# Patient Record
Sex: Male | Born: 1950 | Race: White | Hispanic: No | Marital: Married | Smoking: Never smoker
Health system: Southern US, Community
[De-identification: ages and names within clinical notes are randomized; demographics above are authoritative.]

## PROBLEM LIST (undated history)

## (undated) DIAGNOSIS — N529 Male erectile dysfunction, unspecified: Secondary | ICD-10-CM

## (undated) DIAGNOSIS — G629 Polyneuropathy, unspecified: Secondary | ICD-10-CM

## (undated) DIAGNOSIS — G2581 Restless legs syndrome: Secondary | ICD-10-CM

## (undated) DIAGNOSIS — E119 Type 2 diabetes mellitus without complications: Secondary | ICD-10-CM

## (undated) DIAGNOSIS — M1990 Unspecified osteoarthritis, unspecified site: Secondary | ICD-10-CM

## (undated) DIAGNOSIS — N189 Chronic kidney disease, unspecified: Secondary | ICD-10-CM

## (undated) DIAGNOSIS — Z85828 Personal history of other malignant neoplasm of skin: Secondary | ICD-10-CM

## (undated) DIAGNOSIS — R001 Bradycardia, unspecified: Secondary | ICD-10-CM

## (undated) DIAGNOSIS — C649 Malignant neoplasm of unspecified kidney, except renal pelvis: Secondary | ICD-10-CM

## (undated) DIAGNOSIS — E782 Mixed hyperlipidemia: Secondary | ICD-10-CM

## (undated) DIAGNOSIS — E785 Hyperlipidemia, unspecified: Secondary | ICD-10-CM

## (undated) DIAGNOSIS — E1149 Type 2 diabetes mellitus with other diabetic neurological complication: Secondary | ICD-10-CM

## (undated) DIAGNOSIS — M545 Low back pain, unspecified: Secondary | ICD-10-CM

## (undated) DIAGNOSIS — G8929 Other chronic pain: Secondary | ICD-10-CM

## (undated) DIAGNOSIS — Z8659 Personal history of other mental and behavioral disorders: Secondary | ICD-10-CM

## (undated) DIAGNOSIS — Z950 Presence of cardiac pacemaker: Secondary | ICD-10-CM

## (undated) DIAGNOSIS — M109 Gout, unspecified: Secondary | ICD-10-CM

## (undated) DIAGNOSIS — M419 Scoliosis, unspecified: Secondary | ICD-10-CM

## (undated) DIAGNOSIS — R55 Syncope and collapse: Secondary | ICD-10-CM

## (undated) DIAGNOSIS — N400 Enlarged prostate without lower urinary tract symptoms: Secondary | ICD-10-CM

## (undated) DIAGNOSIS — Z8589 Personal history of malignant neoplasm of other organs and systems: Secondary | ICD-10-CM

## (undated) DIAGNOSIS — M549 Dorsalgia, unspecified: Secondary | ICD-10-CM

## (undated) DIAGNOSIS — E1140 Type 2 diabetes mellitus with diabetic neuropathy, unspecified: Secondary | ICD-10-CM

## (undated) DIAGNOSIS — I10 Essential (primary) hypertension: Secondary | ICD-10-CM

## (undated) HISTORY — DX: Restless legs syndrome: G25.81

## (undated) HISTORY — DX: Syncope and collapse: R55

## (undated) HISTORY — DX: Low back pain, unspecified: M54.50

## (undated) HISTORY — DX: Unspecified osteoarthritis, unspecified site: M19.90

## (undated) HISTORY — DX: Personal history of malignant neoplasm of other organs and systems: Z85.89

## (undated) HISTORY — DX: Chronic kidney disease, unspecified: N18.9

## (undated) HISTORY — DX: Bradycardia, unspecified: R00.1

## (undated) HISTORY — DX: Type 2 diabetes mellitus with other diabetic neurological complication: E11.49

## (undated) HISTORY — DX: Dorsalgia, unspecified: M54.9

## (undated) HISTORY — DX: Scoliosis, unspecified: M41.9

## (undated) HISTORY — DX: Malignant neoplasm of unspecified kidney, except renal pelvis: C64.9

## (undated) HISTORY — DX: Personal history of other malignant neoplasm of skin: Z85.828

## (undated) HISTORY — DX: Mixed hyperlipidemia: E78.2

## (undated) HISTORY — DX: Type 2 diabetes mellitus without complications: E11.9

## (undated) HISTORY — DX: Essential (primary) hypertension: I10

## (undated) HISTORY — DX: Other chronic pain: G89.29

## (undated) HISTORY — DX: Type 2 diabetes mellitus with diabetic neuropathy, unspecified: E11.40

## (undated) HISTORY — DX: Polyneuropathy, unspecified: G62.9

## (undated) HISTORY — DX: Personal history of other mental and behavioral disorders: Z86.59

## (undated) HISTORY — DX: Male erectile dysfunction, unspecified: N52.9

## (undated) HISTORY — DX: Hyperlipidemia, unspecified: E78.5

## (undated) HISTORY — DX: Gout, unspecified: M10.9

## (undated) HISTORY — DX: Low back pain: M54.5

## (undated) HISTORY — DX: Benign prostatic hyperplasia without lower urinary tract symptoms: N40.0

## (undated) HISTORY — DX: Presence of cardiac pacemaker: Z95.0

---
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
## 1977-05-29 HISTORY — PX: ANKLE SURGERY: SHX546

## 1996-05-29 HISTORY — PX: BACK SURGERY: SHX140

## 2000-05-29 HISTORY — PX: ROTATOR CUFF REPAIR: SHX139

## 2005-04-22 ENCOUNTER — Inpatient Hospital Stay (HOSPITAL_COMMUNITY): Admission: EM | Admit: 2005-04-22 | Discharge: 2005-04-25 | Payer: Self-pay | Admitting: Emergency Medicine

## 2006-11-26 ENCOUNTER — Inpatient Hospital Stay (HOSPITAL_COMMUNITY): Admission: EM | Admit: 2006-11-26 | Discharge: 2006-11-28 | Payer: Self-pay | Admitting: Emergency Medicine

## 2006-12-31 ENCOUNTER — Ambulatory Visit: Payer: Self-pay | Admitting: Hospitalist

## 2006-12-31 ENCOUNTER — Inpatient Hospital Stay (HOSPITAL_COMMUNITY): Admission: EM | Admit: 2006-12-31 | Discharge: 2007-01-04 | Payer: Self-pay | Admitting: Emergency Medicine

## 2007-11-17 ENCOUNTER — Inpatient Hospital Stay (HOSPITAL_COMMUNITY): Admission: EM | Admit: 2007-11-17 | Discharge: 2007-11-18 | Payer: Self-pay | Admitting: Emergency Medicine

## 2007-11-18 ENCOUNTER — Ambulatory Visit: Payer: Self-pay | Admitting: Vascular Surgery

## 2007-11-18 ENCOUNTER — Encounter (INDEPENDENT_AMBULATORY_CARE_PROVIDER_SITE_OTHER): Payer: Self-pay | Admitting: Internal Medicine

## 2008-09-21 ENCOUNTER — Ambulatory Visit: Payer: Self-pay | Admitting: Family Medicine

## 2008-09-21 ENCOUNTER — Inpatient Hospital Stay (HOSPITAL_COMMUNITY): Admission: EM | Admit: 2008-09-21 | Discharge: 2008-09-23 | Payer: Self-pay | Admitting: Emergency Medicine

## 2008-12-27 DIAGNOSIS — Z950 Presence of cardiac pacemaker: Secondary | ICD-10-CM

## 2008-12-27 HISTORY — DX: Presence of cardiac pacemaker: Z95.0

## 2009-01-05 ENCOUNTER — Inpatient Hospital Stay (HOSPITAL_COMMUNITY): Admission: AD | Admit: 2009-01-05 | Discharge: 2009-01-12 | Payer: Self-pay | Admitting: Internal Medicine

## 2009-01-05 ENCOUNTER — Encounter: Payer: Self-pay | Admitting: Emergency Medicine

## 2009-01-05 ENCOUNTER — Ambulatory Visit: Payer: Self-pay | Admitting: Diagnostic Radiology

## 2009-01-06 ENCOUNTER — Encounter (INDEPENDENT_AMBULATORY_CARE_PROVIDER_SITE_OTHER): Payer: Self-pay | Admitting: Internal Medicine

## 2009-01-13 HISTORY — PX: PACEMAKER INSERTION: SHX728

## 2009-11-18 ENCOUNTER — Encounter: Admission: RE | Admit: 2009-11-18 | Discharge: 2009-11-18 | Payer: Self-pay | Admitting: Neurology

## 2010-01-12 ENCOUNTER — Ambulatory Visit (HOSPITAL_COMMUNITY): Admission: RE | Admit: 2010-01-12 | Discharge: 2010-01-12 | Payer: Self-pay | Admitting: Neurosurgery

## 2010-08-12 LAB — GLUCOSE, CAPILLARY
Glucose-Capillary: 154 mg/dL — ABNORMAL HIGH (ref 70–99)
Glucose-Capillary: 46 mg/dL — ABNORMAL LOW (ref 70–99)
Glucose-Capillary: 52 mg/dL — ABNORMAL LOW (ref 70–99)
Glucose-Capillary: 64 mg/dL — ABNORMAL LOW (ref 70–99)
Glucose-Capillary: 66 mg/dL — ABNORMAL LOW (ref 70–99)
Glucose-Capillary: 67 mg/dL — ABNORMAL LOW (ref 70–99)
Glucose-Capillary: 77 mg/dL (ref 70–99)

## 2010-09-03 LAB — RENAL FUNCTION PANEL
BUN: 18 mg/dL (ref 6–23)
BUN: 23 mg/dL (ref 6–23)
CO2: 23 mEq/L (ref 19–32)
CO2: 23 mEq/L (ref 19–32)
Calcium: 9.1 mg/dL (ref 8.4–10.5)
Calcium: 9.3 mg/dL (ref 8.4–10.5)
Chloride: 112 mEq/L (ref 96–112)
Creatinine, Ser: 1.33 mg/dL (ref 0.4–1.5)
Creatinine, Ser: 1.42 mg/dL (ref 0.4–1.5)
GFR calc Af Amer: >60 mL/min (ref >60)
Glucose, Bld: 126 mg/dL — ABNORMAL HIGH (ref 70–99)
Glucose, Bld: 149 mg/dL — ABNORMAL HIGH (ref 70–99)
Glucose, Bld: 202 mg/dL — ABNORMAL HIGH (ref 70–99)
Phosphorus: 2.3 mg/dL (ref 2.3–4.6)
Phosphorus: 2.4 mg/dL (ref 2.3–4.6)
Phosphorus: 2.7 mg/dL (ref 2.3–4.6)
Potassium: 4.5 mEq/L (ref 3.5–5.1)
Sodium: 142 mEq/L (ref 135–145)

## 2010-09-03 LAB — GLUCOSE, CAPILLARY
Glucose-Capillary: 126 mg/dL — ABNORMAL HIGH (ref 70–99)
Glucose-Capillary: 158 mg/dL — ABNORMAL HIGH (ref 70–99)
Glucose-Capillary: 113 mg/dL — ABNORMAL HIGH (ref 70–99)
Glucose-Capillary: 116 mg/dL — ABNORMAL HIGH (ref 70–99)
Glucose-Capillary: 120 mg/dL — ABNORMAL HIGH (ref 70–99)
Glucose-Capillary: 130 mg/dL — ABNORMAL HIGH (ref 70–99)
Glucose-Capillary: 134 mg/dL — ABNORMAL HIGH (ref 70–99)
Glucose-Capillary: 135 mg/dL — ABNORMAL HIGH (ref 70–99)
Glucose-Capillary: 136 mg/dL — ABNORMAL HIGH (ref 70–99)
Glucose-Capillary: 138 mg/dL — ABNORMAL HIGH (ref 70–99)
Glucose-Capillary: 138 mg/dL — ABNORMAL HIGH (ref 70–99)
Glucose-Capillary: 139 mg/dL — ABNORMAL HIGH (ref 70–99)
Glucose-Capillary: 143 mg/dL — ABNORMAL HIGH (ref 70–99)
Glucose-Capillary: 152 mg/dL — ABNORMAL HIGH (ref 70–99)
Glucose-Capillary: 170 mg/dL — ABNORMAL HIGH (ref 70–99)
Glucose-Capillary: 39 mg/dL — CL (ref 70–99)
Glucose-Capillary: 40 mg/dL — ABNORMAL LOW (ref 70–99)
Glucose-Capillary: 62 mg/dL — ABNORMAL LOW (ref 70–99)
Glucose-Capillary: 84 mg/dL (ref 70–99)
Glucose-Capillary: 88 mg/dL (ref 70–99)

## 2010-09-03 LAB — UIFE/LIGHT CHAINS/TP QN, 24-HR UR
Albumin, U: DETECTED
Alpha 1, Urine: DETECTED — AB
Gamma Globulin, Urine: DETECTED — AB
Total Protein, Urine: 11.2 mg/dL

## 2010-09-03 LAB — SODIUM, URINE, RANDOM: Sodium, Ur: 13 mEq/L

## 2010-09-03 LAB — VANCOMYCIN, RANDOM: Vancomycin Rm: 11.5 ug/mL

## 2010-09-03 LAB — CARDIAC PANEL(CRET KIN+CKTOT+MB+TROPI)
CK, MB: 1.7 ng/mL (ref 0.3–4.0)
CK, MB: 13.8 ng/mL — ABNORMAL HIGH (ref 0.3–4.0)
CK, MB: 8.6 ng/mL — ABNORMAL HIGH (ref 0.3–4.0)
Relative Index: 0.5 (ref 0.0–2.5)
Relative Index: 0.7 (ref 0.0–2.5)
Total CK: 178 U/L (ref 7–232)
Total CK: 2748 U/L — ABNORMAL HIGH (ref 7–232)
Troponin I: 0.08 ng/mL — ABNORMAL HIGH (ref 0.00–0.06)
Troponin I: 0.01 ng/mL (ref 0.00–0.06)

## 2010-09-03 LAB — BASIC METABOLIC PANEL
BUN: 11 mg/dL (ref 6–23)
BUN: 11 mg/dL (ref 6–23)
BUN: 26 mg/dL — ABNORMAL HIGH (ref 6–23)
CO2: 26 mEq/L (ref 19–32)
Calcium: 8.8 mg/dL (ref 8.4–10.5)
Calcium: 8.7 mg/dL (ref 8.4–10.5)
Calcium: 8.7 mg/dL (ref 8.4–10.5)
Calcium: 9.1 mg/dL (ref 8.4–10.5)
Chloride: 108 mEq/L (ref 96–112)
Creatinine, Ser: 1.22 mg/dL (ref 0.4–1.5)
Creatinine, Ser: 1.31 mg/dL (ref 0.4–1.5)
Creatinine, Ser: 1.53 mg/dL — ABNORMAL HIGH (ref 0.4–1.5)
Creatinine, Ser: 3.29 mg/dL — ABNORMAL HIGH (ref 0.4–1.5)
GFR calc Af Amer: 24 mL/min — ABNORMAL LOW (ref >60)
GFR calc non Af Amer: 57 mL/min — ABNORMAL LOW (ref >60)
GFR calc non Af Amer: 47 mL/min — ABNORMAL LOW (ref >60)
GFR calc non Af Amer: 57 mL/min — ABNORMAL LOW (ref >60)
Glucose, Bld: 144 mg/dL — ABNORMAL HIGH (ref 70–99)
Glucose, Bld: 75 mg/dL (ref 70–99)
Potassium: 5.5 mEq/L — ABNORMAL HIGH (ref 3.5–5.1)
Sodium: 140 mEq/L (ref 135–145)

## 2010-09-03 LAB — COMPREHENSIVE METABOLIC PANEL
ALT: 32 U/L (ref 0–53)
CO2: 28 mEq/L (ref 19–32)
Calcium: 8.9 mg/dL (ref 8.4–10.5)
Chloride: 106 mEq/L (ref 96–112)
Creatinine, Ser: 2.69 mg/dL — ABNORMAL HIGH (ref 0.4–1.5)
GFR calc non Af Amer: 25 mL/min — ABNORMAL LOW (ref >60)
Glucose, Bld: 51 mg/dL — ABNORMAL LOW (ref 70–99)
Total Bilirubin: 0.8 mg/dL (ref 0.3–1.2)

## 2010-09-03 LAB — BLOOD GAS, ARTERIAL
Acid-base deficit: 0.1 mmol/L (ref 0.0–2.0)
Bicarbonate: 24.9 mEq/L — ABNORMAL HIGH (ref 20.0–24.0)
FIO2: 0.28 %
O2 Saturation: 92.7 %
O2 Saturation: 97.9 %
TCO2: 26.3 mmol/L (ref 0–100)
pCO2 arterial: 50.4 mmHg — ABNORMAL HIGH (ref 35.0–45.0)
pO2, Arterial: 63 mmHg — ABNORMAL LOW (ref 80.0–100.0)
pO2, Arterial: 89.9 mmHg (ref 80.0–100.0)

## 2010-09-03 LAB — CBC
HCT: 29.5 % — ABNORMAL LOW (ref 39.0–52.0)
HCT: 28.5 % — ABNORMAL LOW (ref 39.0–52.0)
Hemoglobin: 10.6 g/dL — ABNORMAL LOW (ref 13.0–17.0)
Hemoglobin: 9 g/dL — ABNORMAL LOW (ref 13.0–17.0)
MCHC: 34.5 g/dL (ref 30.0–36.0)
MCHC: 35 g/dL (ref 30.0–36.0)
MCV: 88.4 fL (ref 78.0–100.0)
MCV: 88.6 fL (ref 78.0–100.0)
MCV: 87.8 fL (ref 78.0–100.0)
Platelets: 145 10*3/uL — ABNORMAL LOW (ref 150–400)
Platelets: 149 10*3/uL — ABNORMAL LOW (ref 150–400)
Platelets: 140 10*3/uL — ABNORMAL LOW (ref 150–400)
Platelets: 140 10*3/uL — ABNORMAL LOW (ref 150–400)
RBC: 3.46 MIL/uL — ABNORMAL LOW (ref 4.22–5.81)
RDW: 14.7 % (ref 11.5–15.5)
RDW: 14.2 % (ref 11.5–15.5)
RDW: 15.5 % (ref 11.5–15.5)
WBC: 7 10*3/uL (ref 4.0–10.5)
WBC: 6.3 10*3/uL (ref 4.0–10.5)
WBC: 6.4 10*3/uL (ref 4.0–10.5)

## 2010-09-03 LAB — CULTURE, BLOOD (ROUTINE X 2): Culture: NO GROWTH

## 2010-09-03 LAB — FOLATE: Folate: >20 ng/mL

## 2010-09-03 LAB — DIFFERENTIAL
Basophils Relative: 0 % (ref 0–1)
Eosinophils Absolute: 0.1 10*3/uL (ref 0.0–0.7)
Eosinophils Relative: 1 % (ref 0–5)
Lymphs Abs: 0.8 10*3/uL (ref 0.7–4.0)
Monocytes Relative: 6 % (ref 3–12)
Neutrophils Relative %: 80 % — ABNORMAL HIGH (ref 43–77)

## 2010-09-03 LAB — RAPID URINE DRUG SCREEN, HOSP PERFORMED
Amphetamines: NOT DETECTED
Barbiturates: NOT DETECTED

## 2010-09-03 LAB — CK TOTAL AND CKMB (NOT AT ARMC)
Relative Index: 0.7 (ref 0.0–2.5)
Total CK: 514 U/L — ABNORMAL HIGH (ref 7–232)

## 2010-09-03 LAB — URINALYSIS, ROUTINE W REFLEX MICROSCOPIC
Hgb urine dipstick: NEGATIVE
Specific Gravity, Urine: 1.019 (ref 1.005–1.030)
Urobilinogen, UA: 0.2 mg/dL (ref 0.0–1.0)

## 2010-09-03 LAB — URINE CULTURE
Colony Count: NO GROWTH
Culture: NO GROWTH

## 2010-09-03 LAB — MAGNESIUM: Magnesium: 1.8 mg/dL (ref 1.5–2.5)

## 2010-09-03 LAB — FERRITIN: Ferritin: 82 ng/mL (ref 22–322)

## 2010-09-03 LAB — T4, FREE: Free T4: 1.28 ng/dL (ref 0.80–1.80)

## 2010-09-03 LAB — IRON AND TIBC
Saturation Ratios: 14 % — ABNORMAL LOW (ref 20–55)
UIBC: 254 ug/dL

## 2010-09-03 LAB — CK: Total CK: 933 U/L — ABNORMAL HIGH (ref 7–232)

## 2010-09-03 LAB — PROTIME-INR: Prothrombin Time: 15 seconds (ref 11.6–15.2)

## 2010-09-03 LAB — HEMOGLOBIN A1C: Hgb A1c MFr Bld: 6.6 % — ABNORMAL HIGH (ref 4.6–6.1)

## 2010-09-04 LAB — DIFFERENTIAL
Basophils Absolute: 0 10*3/uL (ref 0.0–0.1)
Eosinophils Absolute: 0.5 10*3/uL (ref 0.0–0.7)
Eosinophils Relative: 6 % — ABNORMAL HIGH (ref 0–5)

## 2010-09-04 LAB — GLUCOSE, CAPILLARY: Glucose-Capillary: 86 mg/dL (ref 70–99)

## 2010-09-04 LAB — POCT CARDIAC MARKERS
CKMB, poc: 20.3 ng/mL (ref 1.0–8.0)
Myoglobin, poc: >500 ng/mL (ref 12–200)
Troponin i, poc: <0.05 ng/mL (ref 0.00–0.09)

## 2010-09-04 LAB — CBC
HCT: 30.7 % — ABNORMAL LOW (ref 39.0–52.0)
MCHC: 30.2 g/dL (ref 30.0–36.0)
MCV: 87.4 fL (ref 78.0–100.0)
Platelets: 188 10*3/uL (ref 150–400)
RDW: 13.9 % (ref 11.5–15.5)

## 2010-09-04 LAB — BASIC METABOLIC PANEL
BUN: 44 mg/dL — ABNORMAL HIGH (ref 6–23)
Chloride: 100 mEq/L (ref 96–112)
Glucose, Bld: 113 mg/dL — ABNORMAL HIGH (ref 70–99)
Potassium: 6.1 mEq/L — ABNORMAL HIGH (ref 3.5–5.1)

## 2010-09-04 LAB — ACETAMINOPHEN LEVEL: Acetaminophen (Tylenol), Serum: <10 ug/mL — ABNORMAL LOW (ref 10–30)

## 2010-09-04 LAB — HEPATIC FUNCTION PANEL
Bilirubin, Direct: 0 mg/dL (ref 0.0–0.3)
Indirect Bilirubin: 0.4 mg/dL (ref 0.3–0.9)

## 2010-09-07 LAB — DIFFERENTIAL
Eosinophils Absolute: 0.2 10*3/uL (ref 0.0–0.7)
Lymphs Abs: 1.4 10*3/uL (ref 0.7–4.0)
Monocytes Absolute: 0.6 10*3/uL (ref 0.1–1.0)
Monocytes Relative: 8 % (ref 3–12)
Neutrophils Relative %: 71 % (ref 43–77)

## 2010-09-07 LAB — RAPID URINE DRUG SCREEN, HOSP PERFORMED
Amphetamines: NOT DETECTED
Barbiturates: NOT DETECTED
Opiates: POSITIVE — AB

## 2010-09-07 LAB — URINALYSIS, ROUTINE W REFLEX MICROSCOPIC
Bilirubin Urine: NEGATIVE
Glucose, UA: NEGATIVE mg/dL
Hgb urine dipstick: NEGATIVE
Ketones, ur: NEGATIVE mg/dL
Nitrite: NEGATIVE
Specific Gravity, Urine: 1.019 (ref 1.005–1.030)
pH: 5.5 (ref 5.0–8.0)

## 2010-09-07 LAB — BASIC METABOLIC PANEL
BUN: 31 mg/dL — ABNORMAL HIGH (ref 6–23)
CO2: 23 mEq/L (ref 19–32)
CO2: 26 mEq/L (ref 19–32)
Calcium: 8.4 mg/dL (ref 8.4–10.5)
Calcium: 8.7 mg/dL (ref 8.4–10.5)
Chloride: 107 mEq/L (ref 96–112)
Creatinine, Ser: 2.7 mg/dL — ABNORMAL HIGH (ref 0.4–1.5)
GFR calc Af Amer: 30 mL/min — ABNORMAL LOW (ref >60)
GFR calc non Af Amer: 25 mL/min — ABNORMAL LOW (ref >60)
Glucose, Bld: 170 mg/dL — ABNORMAL HIGH (ref 70–99)
Potassium: >7.5 mEq/L (ref 3.5–5.1)

## 2010-09-07 LAB — CBC
HCT: 28.5 % — ABNORMAL LOW (ref 39.0–52.0)
Hemoglobin: 11 g/dL — ABNORMAL LOW (ref 13.0–17.0)
Hemoglobin: 9.8 g/dL — ABNORMAL LOW (ref 13.0–17.0)
MCHC: 34.3 g/dL (ref 30.0–36.0)
MCHC: 34.6 g/dL (ref 30.0–36.0)
MCV: 88.1 fL (ref 78.0–100.0)
Platelets: 165 10*3/uL (ref 150–400)
RBC: 3.23 MIL/uL — ABNORMAL LOW (ref 4.22–5.81)
RDW: 14.9 % (ref 11.5–15.5)

## 2010-09-07 LAB — COMPREHENSIVE METABOLIC PANEL
ALT: 23 U/L (ref 0–53)
Albumin: 3.8 g/dL (ref 3.5–5.2)
Calcium: 8.8 mg/dL (ref 8.4–10.5)
GFR calc Af Amer: 20 mL/min — ABNORMAL LOW (ref >60)
Glucose, Bld: 101 mg/dL — ABNORMAL HIGH (ref 70–99)
Sodium: 133 mEq/L — ABNORMAL LOW (ref 135–145)
Total Protein: 6.3 g/dL (ref 6.0–8.3)

## 2010-09-07 LAB — RPR: RPR Ser Ql: NONREACTIVE

## 2010-09-07 LAB — CARDIAC PANEL(CRET KIN+CKTOT+MB+TROPI)
CK, MB: 4.3 ng/mL — ABNORMAL HIGH (ref 0.3–4.0)
CK, MB: 9.7 ng/mL — ABNORMAL HIGH (ref 0.3–4.0)
Total CK: 2565 U/L — ABNORMAL HIGH (ref 7–232)
Total CK: 2864 U/L — ABNORMAL HIGH (ref 7–232)
Troponin I: 0.02 ng/mL (ref 0.00–0.06)
Troponin I: <0.01 ng/mL (ref 0.00–0.06)

## 2010-09-07 LAB — CSF CULTURE W GRAM STAIN: Culture: NO GROWTH

## 2010-09-07 LAB — GLUCOSE, CAPILLARY
Glucose-Capillary: 161 mg/dL — ABNORMAL HIGH (ref 70–99)
Glucose-Capillary: 164 mg/dL — ABNORMAL HIGH (ref 70–99)
Glucose-Capillary: 190 mg/dL — ABNORMAL HIGH (ref 70–99)
Glucose-Capillary: 201 mg/dL — ABNORMAL HIGH (ref 70–99)
Glucose-Capillary: 221 mg/dL — ABNORMAL HIGH (ref 70–99)

## 2010-09-07 LAB — RENAL FUNCTION PANEL
Albumin: 3 g/dL — ABNORMAL LOW (ref 3.5–5.2)
BUN: 16 mg/dL (ref 6–23)
BUN: 24 mg/dL — ABNORMAL HIGH (ref 6–23)
CO2: 26 mEq/L (ref 19–32)
CO2: 27 mEq/L (ref 19–32)
Calcium: 8.9 mg/dL (ref 8.4–10.5)
Chloride: 110 mEq/L (ref 96–112)
Creatinine, Ser: 1.33 mg/dL (ref 0.4–1.5)
Creatinine, Ser: 1.89 mg/dL — ABNORMAL HIGH (ref 0.4–1.5)
GFR calc Af Amer: 45 mL/min — ABNORMAL LOW (ref >60)
GFR calc non Af Amer: 37 mL/min — ABNORMAL LOW (ref >60)
Glucose, Bld: 192 mg/dL — ABNORMAL HIGH (ref 70–99)
Phosphorus: 2.8 mg/dL (ref 2.3–4.6)
Potassium: 5.4 mEq/L — ABNORMAL HIGH (ref 3.5–5.1)
Sodium: 139 mEq/L (ref 135–145)

## 2010-09-07 LAB — POCT I-STAT 3, VENOUS BLOOD GAS (G3P V)
Acid-base deficit: 4 mmol/L — ABNORMAL HIGH (ref 0.0–2.0)
O2 Saturation: 71 %
Patient temperature: 98.6
pO2, Ven: 43 mmHg (ref 30.0–45.0)

## 2010-09-07 LAB — CSF CELL COUNT WITH DIFFERENTIAL
RBC Count, CSF: 84 /mm3 — ABNORMAL HIGH
Tube #: 1

## 2010-09-07 LAB — PROTEIN AND GLUCOSE, CSF
Glucose, CSF: 81 mg/dL — ABNORMAL HIGH (ref 43–76)
Total  Protein, CSF: 42 mg/dL (ref 15–45)

## 2010-09-07 LAB — LACTIC ACID, PLASMA: Lactic Acid, Venous: 0.9 mmol/L (ref 0.5–2.2)

## 2010-09-07 LAB — CULTURE, BLOOD (ROUTINE X 2)
Culture: NO GROWTH
Culture: NO GROWTH

## 2010-09-07 LAB — ETHANOL: Alcohol, Ethyl (B): 6 mg/dL (ref 0–10)

## 2010-09-07 LAB — HEMOGLOBIN A1C
Hgb A1c MFr Bld: 6.7 % — ABNORMAL HIGH (ref 4.6–6.1)
Mean Plasma Glucose: 146 mg/dL

## 2010-09-07 LAB — TSH: TSH: 0.547 u[IU]/mL (ref 0.350–4.500)

## 2010-09-07 LAB — URINE CULTURE
Colony Count: NO GROWTH
Culture: NO GROWTH

## 2010-09-07 LAB — CK: Total CK: 1588 U/L — ABNORMAL HIGH (ref 7–232)

## 2010-09-07 LAB — VITAMIN B12: Vitamin B-12: 460 pg/mL (ref 211–911)

## 2010-10-11 NOTE — Consult Note (Signed)
Barry [name], Barry [name]             ACCOUNT NO.:  [id number]   MEDICAL RECORD NO.:  [id number]          PATIENT TYPE:  INP   LOCATION:  6731                         FACILITY:  [facility]   PHYSICIAN:  Jordan Hawks. Elnoria Howard, MD    DATE OF BIRTH:  12/20/1950   DATE OF CONSULTATION:  01/03/2007  DATE OF DISCHARGE:                                 CONSULTATION   GI CONSULTATION:   REASON FOR CONSULTATION:  Heme-positive stool and anemia.   REFERRING PHYSICIAN:  Teaching service.   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  The patient was noted to have  angioedema and subsequently was treated with prednisone and currently he  has improved.  Over the brief course of his hospitalization the patient  is noted to have a drop in his hemoglobin from the 10 range down to 7.7.  Apparently the patient has had some type of outpatient workup where he  was noted to have a history of heme-positive stools.  Per the patient's  report he has had two colonoscopies in [place], [place], and also  in [place], [place]; however, his history can be questioned.  Apparently  the chart states that he is to have a colonoscopy scheduled further in  the near future; however, upon discussion with the patient he denies  having any evaluation at this time.  Additionally, he also has denied  having taken any antibiotics prior to the onset of his angioedema;  therefore, the history is quite confusing for the patient.  Irregardless, he was found to have an anemia of 7.7 and heme-positive  stools.   PAST MEDICAL AND SURGICAL HISTORY:  As stated above.   FAMILY HISTORY:  Noncontributory.   SOCIAL HISTORY:  Negative for alcohol, tobacco, or  illicit drug use.   ALLERGIES:  No known drug allergies.   MEDICATIONS:  Prednisone, clonazepam, Pepcid, gabapentin, insulin, and  Kayexalate.   VITAL SIGNS:  Blood pressure is 144/78, heart rate is 74, respirations  20, temperature is 98.3.  GENERAL:  The patient is in no acute distress, alert and oriented.  HEENT:  Normocephalic, atraumatic.  Extraocular muscles intact.  NECK:  Supple.  No lymphadenopathy.  LUNGS:  Clear to auscultation bilaterally.  CARDIOVASCULAR:  Regular rate and rhythm.  ABDOMEN:  Flat, soft, nontender, nondistended, obese.  EXTREMITIES:  No clubbing, cyanosis or edema.  RECTAL:  Examination is deferred at this time.   LABORATORY VALUES:  White blood cell count is 4.7, hemoglobin 7.7, MCV  is 85.9, platelets at 254.  Sodium 144, potassium 4.3, chloride 114, CO2  25, glucose 65, BUN is 30, creatinine 1.6.   IMPRESSION:  1. Heme-positive stool.  2. Anemia.  3. Angioedema.  4. Multiple other medical problems.   At this time the patient is stable.  Further evaluation with an EGD and  colonoscopy  is necessary to further work up his current symptoms of  anemia.  The patient is a poor versus difficult historian.  However, the  objective evidence does mandate further evaluation endoscopically.   PLAN:  For EGD and colonoscopy tomorrow.  Further evaluation pending the  results.      Jordan Hawks Elnoria Howard, MD  Electronically Signed     PDH/MEDQ  D:  01/03/2007  T:  01/04/2007  Job:  [id number]   cc:   [facility] Teaching Service
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
## 2010-10-11 NOTE — Discharge Summary (Signed)
NAMEJOELL, USMAN NO.:  [id number]   MEDICAL RECORD NO.:  [id number]          PATIENT TYPE:  INP   LOCATION:  5508                         FACILITY:  [facility]   PHYSICIAN:  Ramiro Harvest, MD    DATE OF BIRTH:  08-20-1950   DATE OF ADMISSION:  01/05/2009  DATE OF DISCHARGE:  01/12/2009                               DISCHARGE SUMMARY   DIAGNOSES AT THE TIME OF DISCHARGE:  1. Syncope in the setting of bradycardia, status post dual-chamber      permanent pacemaker placement on January 11, 2009, by Dr. Reyes Ivan.  2. Rhabdomyolysis, improved with IV hydration.  3. Acute renal failure, resolved after hydration.  4. Altered mental status felt likely secondary to narcotics and acute      renal failure.  The patient currently at baseline.  I plan to      continue to hold obtaining narcotics at the time of discharge.  5. Hypertension.  The patient was placed on Norvasc during this      admission.  Lopressor and losartan have been held and will need      consideration for resuming followup appointment, pending followup      blood pressure.   HISTORY OF PRESENT ILLNESS:  Mr. Uhde is a 60 year old male, who  presented on January 05, 2009, with altered mental status.  He has a  history of recurrent acute-on-chronic renal failure.  He was discharged  previously on April 29 and apparently had since that time been having  occasional syncopal episodes for which he was placed on Holter monitor  by Dr. Anne Fu of Cardiology.  At the time of his discharge, he was  instructed to stop his Lyrica, his ACE inhibitor, his ARB.  He was still  taking his Lyrica once a day at the time that he was readmitted.  He was  admitted for further evaluation and treatment.   COURSE OF HOSPITALIZATION:  1. Syncope secondary to bradycardia.  The patient was admitted.  He      was placed on telemetry.  He had a CT of the head, which was      negative.  He also had a 2-D echo performed during this  admission.      His beta-blocker was held, and Cardiology consult was requested.      The patient was seen in consultation by Dr. Donato Schultz.  Thyroid      panel was noted to be within normal limits.  Dr. Reyes Ivan was      consulted for pacer evaluation.  As he continued with an episode of      bradycardia even while off his beta-blocker, this was placed on      January 11, 2009 without complication.  2. Altered mental status.  He is currently at his mental status      baseline.  This was felt secondary to narcotics.  We will continue      his benzos on a p.r.n. only basis.  Continue him off his morphine      sulfate and off his Lyrica.  We will continue Vicodin on a  p.r.n.      basis only.  We will also discontinue standing Benadryl as this may      have contributed to the patient's somnolence and altered mental      status.  3. Hypertension.  The patient's blood pressure is currently stable in      the 120 systolic at the time of discharge.  He is only receiving      Norvasc 10 mg p.o. daily for his blood pressure.  His ARB was held      due to acute renal insufficiency and his beta-blocker was held in      the setting of bradycardia.  He will need follow up with his      primary care physician in the next week for followup BP check and      consideration for resumption of these medications.  Also, of note,      the patient did have an elevated cardiac enzymes status post      permanent pacemaker placement which Cardiology felt was secondary      to procedure and not secondary to acute coronary syndrome.  4. Diabetes, type 2.  Continue home insulin at time of discharge.   MEDICATIONS AT THE TIME OF DISCHARGE:  1. Clonazepam 1 mg p.o. t.i.d. p.r.n.  2. Flomax 0.4 mg p.o. daily.  3. Lantus SoloStar 30 units subcu daily.  4. Fish oil 1200 mg p.o. daily.  5. Hydrocodone with acetaminophen 7.5 - 500 one tablet every 6 hours      as needed for pain.  6. Simvastatin 20 mg p.o. every  evening.   Please note that the following medications have been held:  Benadryl,  Lyrica, morphine sulfate, metoprolol, losartan, and Lasix.  Dr. Anne Fu  will evaluate ongoing need for Lasix at his followup appointment on  September 1.   PERTINENT LABORATORIES AT THE TIME OF DISCHARGE:  Hemoglobin 10.1,  hematocrit 29.5, and white blood cell count 140.  Potassium 3.3 prior to  repletion, chloride 110, bicarb 27, BUN 11, creatinine 1.3, and glucose  128.  Cardiac enzymes, he did have troponin as high as 0.13 plus  permanent pacemaker placement.  Chest x-ray post permanent pacemaker  placement shows no pneumothorax and slight CHF.   DIET AT THE TIME OF DISCHARGE:  He will be maintained on a low-sodium  heart-healthy diabetic diet.   ACTIVITY:  He is instructed not to raise his left arm above his head for  4 weeks.  No driving for 1 week and to follow the pacemaker discharge  instructions per Cardiology.   FOLLOWUP:  He is to follow up with Dr. Donato Schultz on January 27, 2009,  and with Dr. Arrie Aran on January 22, 2009.  He is also instructed to  follow up with Dr. Joycelyn Rua on January 20, 2009, at 11 o'clock a.m.  At this appointment with Dr. Lenise Arena, he will need to follow up BP check  as well as follow up electrolytes.  Please note, his potassium today  prior to discharge is 3.3, and this has been repleted.   Greater than 30 minutes spent on discharge planning.      Sandford Craze, NP      Ramiro Harvest, MD  Electronically Signed    MO/MEDQ  D:  01/12/2009  T:  01/13/2009  Job:  [id number]   cc:   Joycelyn Rua, M.D.  Jake Bathe, MD  Terrial Rhodes, M.D.

## 2010-10-11 NOTE — Discharge Summary (Signed)
NAMEGRAVIEL, Barry [name]             ACCOUNT NO.:  [id number]   MEDICAL RECORD NO.:  [id number]          PATIENT TYPE:  INP   LOCATION:  1417                         FACILITY:  [facility]   PHYSICIAN:  Ladell Pier, M.D.   DATE OF BIRTH:  [birth year]   DATE OF ADMISSION:  11/17/2007  DATE OF DISCHARGE:  11/18/2007                               DISCHARGE SUMMARY   DISCHARGE DIAGNOSES:  1. Hypoglycemia.  2. Hyperkalemia.  3. Chronic kidney disease with a history of one kidney with previous      history of left nephrectomy at the age of [age] for Wilms' tumor.  4. Scoliosis with history of chronic back pain.  5. Type 2 diabetes on insulin.  6. Bipolar disorder.  7. Decreased TSH level.  8. Pain and swelling of the left knee.  9. Dyslipidemia.  10.Hypertension.  11.History of shingles in the right lower back in November 2005      complicated by post herpetic neuralgia.  12.Anemia.   DISCHARGE MEDICATIONS:  1. Morphine 30 mg daily.  2. Flomax 0.4 mg daily.  3. Norvasc 10 mg daily.  4. Hydrocodone/acetaminophen 7.5/500 mg every 6 hours.  5. Allopurinol 100 mg daily.  6. Colchicine 0.6 mg daily.  7. Vyvanse 50 mg daily.  8. Lyrica 150 mg daily.  9. Prednisone taper.  10.Lantus 20 units.   FOLLOWUP APPOINTMENT:  Patient told to follow up with his primary care  physician in the morning if no resolution of his knee swelling and pain  with the prednisone.   PROCEDURES:  None.   CONSULTANTS:  None.   HISTORY OF PRESENT ILLNESS:  Patient is a 60 year old white male that  came to the emergency room after he took 30 mg morphine.  He became  sleepy, drowsy, then became very somnolent.  His wife called 911.  He  was brought to the emergency room, was shown to have a blood sugar of  46.  He was given D50 by the fire department, and after that he became  awake and more alert.  He usually takes Lantus about 30 units.  Patient  was admitted for hyperkalemia.  His potassium was 7 on  admission.   Past medical history, family history, social history, medications,  allergies, review of systems per admission H&P.   PHYSICAL EXAM ON DISCHARGE:  Temperature 98.5, pulse 81 respirations 50,  blood pressure 138/86, pulse oximetry 96% on room air.  __________ 159  to 292.  HEENT:  Normocephalic, atraumatic.  Pupils are reactive to light.  Throat without erythema.  CARDIOVASCULAR:  Regular rate and rhythm.  LUNGS:  Clear bilaterally.  ABDOMEN:  Positive bowel sounds.  EXTREMITIES:  Without edema except that his left lower extremity seems a  little bigger than the right, and he does have increased swelling in the  left knee, although it is not very tender to palpation and he has good  range of motion.   HOSPITAL COURSE:  1. Hypoglycemia.  He was treated with D50.  Blood sugar remained      stable and mildly high during his admission.  2. Somnolence.  That  resolved prior to admission with patient given      D50 for the hypoglycemia.  3. Diabetes.  With the hypoglycemic event, decreased his Lantus to 20      units.  He may need to have it increased by tomorrow because he is      on the prednisone for possibly gout in the knee.  4. Knee pain.  We will get x-ray of the knee and also we will get an      ultrasound of the left lower extremity to rule out DVT.  Did      discuss with patient that he should follow up with his primary care      physician in the morning if he has no resolution of the knee pain      as per the testing, but we will get the x-ray and the ultrasound      before he goes home.  He states that he normally has gout in no      particular region, just pretty much all over.  This could be gout,      but if it does not get better, he should follow up with his primary      care doctor in the morning.  5. Hypertension.  Blood pressure is mildly elevated.  The ACE      inhibitor was discontinued secondary to the renal insufficiency      that he experienced when  he came in, and he is now on Norvasc.  He      will follow up with his primary care physician for adjustments and      further management of hypertension.  6. Chronic renal insufficiency secondary to history of Wilms' tumor      status post nephrectomy.  I am not sure what his baseline kidney      function is, and patient does not know what his baseline kidney      function is, but we will continue the Norvasc.  Presently his      creatinine has improved from 1.8 to 1.4.  We will discharge him      with that creatinine and he will follow up with his primary care      doctor as well in the morning.  7. Abnormal TSH.  His TSH is 0.110, which is low.  We ordered a free      T4 and patient will be discharged before the results are back.  Did      put on the sheet however that he should follow up with his primary      care physician for this abnormal TSH as the free T4 is still      pending.  8. Anemia.  Patient was noted to have anemia, but I am not sure what      his baseline hemoglobin is.  It has how veer been stable.  He will      follow up with his primary care physician for further workup if      this is indeed new, or he most likely has chronic anemia from his      renal insufficiency.   DISCHARGE LABORATORY:  TSH 0.110.  Sodium 142, potassium 4.2, chloride  110, CO2 of 26, glucose 137, BUN 24, creatinine 1.48.  LFTs are normal.  WBC 5.7, hemoglobin 10.5, platelet 178.      Ladell Pier, M.D.  Electronically Signed     NJ/MEDQ  D:  11/18/2007  T:  11/18/2007  Job:  [id number]

## 2010-10-11 NOTE — H&P (Signed)
Barry [name], STILLE NO.:  [id number]   MEDICAL RECORD NO.:  [id number]          PATIENT TYPE:  INP   LOCATION:  1611                         FACILITY:  [facility]   PHYSICIAN:  Isidor Holts, M.D.  DATE OF BIRTH:  August 12, 1950   DATE OF ADMISSION:  11/26/2006  DATE OF DISCHARGE:                              HISTORY & PHYSICAL   PRIMARY CARE PHYSICIAN:  Dr. Royanne Foots - [place], [place].   CHIEF COMPLAINT:  Joint pains, left shoulder, left wrist and hip for  approximately five days.  This has been progressive, associated with  weakness of the left upper and lower extremities, which began on November 21, 2006.   HISTORY OF PRESENT ILLNESS:  This is a 60 year old male.  For the past  medical history, see below. According to the patient, who provided the  history, which was also supplemented by his spouse, the patient was  quite well until November 21, 2006, when he developed vomiting in the  evening after dinner.  He vomited at least six times.  No history of  ingestion of unusual foods.  No clustering of cases, as the same food  was eaten by his spouse.  He states that following this, his mouth felt  quite dry. His wife stated that his speech appeared somewhat slurred,  although occasionally he does have somewhat slurred speech after taking  a lot of his medication.  According to the patient, the vomiting ceased  on that day.  By November 22, 2006, he no longer vomited; however, he  developed joint pains bilaterally, associated with muscle pains in the  upper and lower extremities.  This became progressive and by November 24, 2006, these symptoms had improved on the right side, but had worsened on  the left, so much so that he now has quite severe pain in the left  shoulder, left wrist and left hip exacerbated by movement.  He has  therefore been unable to weight-bear.  He denies fever, denies shortness  of breath.  On November 26, 2006, in the a.m. he had stool incontinence  but  denies urinary incontinence.  Denies dysuria, denies chest pain or  shortness of breath.  Denies diarrhea.   PAST MEDICAL HISTORY:  1. Type 2 diabetes mellitus.  2. Scoliosis/chronic low back pain.  3. Hypertension.  4. 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  5. Query hypoplastic lungs.  6. Bipolar disorder.  7. Iron deficiency anemia.  8. Dyslipidemia.  9. Shingles, right lower back in November 2005, complicated by post-      herpetic neuralgia.   MEDICATIONS:  The patient does not know the doses of some of his  medications.  This much was gleaned.  1. Multivitamin one daily.  2. Folic acid 5 mg daily.  3. Lyrica 150 mg p.o. q.a.m., 225 mg p.o. q.p.m.  4. Lisinopril, query dosage.  5. Budeprion SR, query dosage.  6. Tri-Cor 48 mg p.o. daily.  7. Aspirin 81 mg p.o. daily.  8. Clonazepam 0.5 mg,  one to two pills p.o. p.r.n.  9. Lantus 20 units subcutaneously q.h.s.   ALLERGIES:  No known drug allergies.   SOCIAL HISTORY:  The patient is married.  He is a nonsmoker, nondrinker.  He has no history of drug abuse.  He has three offspring, all alive and  well.   FAMILY HISTORY:  The patient has one brother and one sister.  Both of  them have diabetes mellitus.  His mother died in 10-28-1999, from advanced  cancer.  His father has hypertension.  The family history otherwise is  noncontributory.   PHYSICAL EXAMINATION:  VITAL SIGNS:  Temperature 100.3 degrees, pulse 84  per minute and regular, respirations 18, blood pressure 116/70 mmHg,  pulse oximetry 100% on room air.  GENERAL:  The patient does not appear to be in any acute distress,  clear, communicative, not short of breath at rest.  HEENT:  No pallor, no jaundice.  No conjunctival injection.  Throat:  Visible mucous membranes appear somewhat dry.  NECK:  Supple, JVP not seen.  No palpable lymphadenopathy, no carotid  bruits.  CHEST:  Clear to auscultation.  No  wheezes or crackles.  HEART:  Sounds x2 heard.  No rubs, no murmurs.  ABDOMEN:  Full, soft, nontender.  No palpable cardiomegaly, no palpable  masses.  Normal bowel sounds.  No suppurative scars are noted.  EXTREMITIES:  Lower extremity examination shows no pitting edema,  palpable peripheral pulses.  MUSCULOSKELETAL:  The patient has bogginess and tenderness in the left  shoulder.  Also bogginess and tenderness and increased local temperature  in the left wrist.  He experiences marked pain to motion in these  affected joints.  Also he has pain on flexion and abduction of the left  hip.  The rest of the musculoskeletal system examination appeared quite  unremarkable.  CENTRAL NERVOUS SYSTEM:  No focal neurological deficit on gross  examination.   INVESTIGATIONS:  CBC:  WBC 7.7, hemoglobin 10.3, hematocrit 30.3, MCV  85, platelets 307.  ESR 136, INR 1.2.  Electrolytes:  Sodium 138,  potassium 4.6, chloride 104, CO2 of 25, BUN 27, creatinine 1.94, glucose  183.   X-RAYS:  X-ray of the left shoulder dated November 26, 2006, shows no acute  findings.  A brain MRI dated November 26, 2006, was negative.  MRI of the  cervical spine dated November 26, 2006, showed moderately large central  herniated nucleus pulposus at C4 to C5, with flattening of the cord and  mild spinal stenosis, also moderate spondylosis at C3 to C4, C5 to C6,  especially on the left, and C6 to C7.   ASSESSMENT/PLAN:  1. 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  We will do a rheumatologic      screening, including ANA , anti-DS DNA, C3, C4 complement levels,      and rheumatoid factor.  Meanwhile, will treat empirically with anti-      inflammatory medications and since NSAIDS are contra-indicated due      to renal dysfunction, will utilize steroid treatment for now.  Will      request a rheumatology consultation on November 27, 2006.   1. Left-sided weakness:  This  appears to me, to be secondary to severe      pain, particularly in the left wrist and left shoulder; however, as      the patient is on Tri-Cor, it would be pertinent to evaluate for  possible myopathy.  We will do a CK and check TSH.  Discontinue Tri-      Cor and consider neurology consultation on November 27, 2006.  The      patient will certainly need PT/OT.   1. Dehydration/acute renal failure:  This is likely secondary to      vomiting, against a background of vomiting and poor oral intake.      We will manage with intravenous hydration, follow renal indices,      and hold ACE inhibitor.   1. Type 2 diabetes mellitus. Query control:  Will check A1c, lace the      patient on a modified diet and use sliding scale coverage for now.   1. Post-herpetic neuralgia/chronic pain:  We shall continue analgesics      for now.   1. Normocytic anemia:  We shall do iron studies, B12 and folate.   Further management will depend upon his course.      Isidor Holts, M.D.  Electronically Signed     CO/MEDQ  D:  11/26/2006  T:  11/27/2006  Job:  [id number]   cc:   Royanne Foots, M.D.  [place], [place] - [facility]  [phone]

## 2010-10-11 NOTE — Consult Note (Signed)
NAMEAVORY, MIMBS NO.:  [id number]   MEDICAL RECORD NO.:  [id number]          PATIENT TYPE:  INP   LOCATION:  5508                         FACILITY:  [facility]   PHYSICIAN:  Jake Bathe, MD      DATE OF BIRTH:  09-22-1950   DATE OF CONSULTATION:  01/08/2009  DATE OF DISCHARGE:                                 CONSULTATION   REFERRING PHYSICIAN:  Ramiro Harvest, MD   REASON FOR CONSULTATION:  Evaluation of bradycardia and recent syncope.   HISTORY OF PRESENT ILLNESS:  A 60 year old male who I have seen  previously in the office for workup of syncope who was recently  hospitalized with worsening acute on chronic renal failure,  rhabdomyolysis, and syncopal episode.  Currently, he is on telemetry  today.  There was a 2.2-second pause noted, as well as sinus bradycardia  with heart rates into the upper 30s.  During these pauses; however, he  was asymptomatic and not complaining of any dizziness.  He has been able  to ambulate the hallways well without any difficulty even with these  lower blood pressures.   He was on metoprolol 25 mg twice a day, which was discontinued today.   In reviewing his syncopal episodes.  He had one in April where he was in  his kitchen and passed out suddenly, hit his head.  He had another  episode where he was crouch down behind his car, then passed out.  Most  recent episode was on Sunday.  Unfortunately, he did have his life watch  monitor, but he was not wearing it at that time.  He was rushing to  leave the house and while outside, he passed out while standing and was  caught by one of his friends.  He has had a history of longstanding  narcotic use including morphine, Lyrica, clonazepam, and other  benzodiazepines.  Certainly these could be playing a role.   PAST MEDICAL HISTORY:  Diabetes, hypertension, chronic kidney disease  with congenital hydronephritis, prior Wilms tumor, back pain, gout,  restless leg, BPH,  hyperlipidemia, depression with history of suicide  attempts in the past, syncope.   MEDICATIONS:  Reviewed.  See HPI for home medications.  Most notable, he  was taking metoprolol tartrate 25 mg twice a day.  He may had been  taking low-dose losartan also.  He was taking frusemide 40 mg once a day  when I saw him last.   SOCIAL HISTORY:  No smoking.  No alcohol.  No drug use.  He is married.   FAMILY HISTORY:  Currently noncontributory.   REVIEW OF SYSTEMS:  Recent low-grade fever, syncope as described above.  Prior depression.  Unless stated previously, all other 12 review of  systems negative.  He denies any chest pain or shortness of breath.   PHYSICAL EXAMINATION:  VITAL SIGNS:  Blood pressure 152/80, pulse in the  40s mostly, but fluctuating throughout the day.  He also has pulses low  as the upper 30s.  Temperature 98.5, respirations 16, sating 98% on room  air.  He is positive 1.1 L.  LUNGS:  Pauses 2.2 seconds.  GENERAL:  Alert and oriented x3 in no acute distress, slightly flattened  affect, here with his wife at bedside.  EYES:  Well-perfused conjunctivae.  EOMI, no scleral icterus.  NECK:  Supple.  No lymphadenopathy.  No carotid bruits.  No JVD, supple.  CARDIOVASCULAR:  Bradycardic, regular rhythm without any appreciable  murmurs, rubs, or gallops.  LUNGS:  Clear to auscultation bilaterally.  No wheezes.  No rales.  ABDOMEN:  Soft, nontender, normoactive bowel sounds.  No masses.  No  bruits.  EXTREMITIES:  No clubbing, cyanosis, or edema.  Pulses 2+ peripheral.  SKIN:  Warm, dry, and intact.  No rashes.  NEUROLOGIC:  Nonfocal.  Moves all extremities x4.  Ambulates well.  PSYCHOLOGY:  Slightly flattened affect.   DATA:  Telemetry reviewed as above.  EKG from 8/10 shows sinus rhythm  rate 85 with no other abnormalities.  Echocardiogram shows EF normal  with mild aortic valve regurgitation.  He also has a mildly to  moderately dilated ascending aortic arch measured  at 47 mm.  His chest x-  ray showed no active disease, creatinine last check was 1.3.  Potassium  4.1, potassium when on admission was elevated at 6.  TSH is normal.  Free T4 is normal.  Hemoglobin A1c 6.6, slightly elevated.  Cardiac  biomarkers show an elevated creatinine most recently of 933 down to 514.  Troponin are normal.   ASSESSMENT/PLAN:  A 60 year old male with multiple syncopal episodes now  with bradycardia demonstrated by telemetry monitoring.  1. Bradycardia - it is interesting that his bradycardia currently is      asymptomatic.  He is having no dizziness or any other issues with      his heart rate being in the upper 30s to mid 40s.  Certainly;      however, if his heart rate were to decrease or if he would have      increased pauses especially greater than 3 seconds, this may be the      etiology for his prior syncopal episodes.  Unfortunately, I was      unable to elicit any of these during his monitor usage, which was      approximately 2 weeks in duration at this point.  Unfortunately, he      was not wearing his monitor when he had his last syncopal episode.      I agree with discontinuation of the metoprolol.  I would monitor      here on telemetry until further notice.  Certainly, pacemaker may      be warranted if bradycardia does not improve.  2. Syncope - may be secondary to bradycardia as is being elicited by      telemetry.  He may have increased vagal tone also.  We are      discontinuing his metoprolol 25 mg twice a day.  Some of the      syncopal episodes are sudden in onset and usually occur while      standing.  He had one episode in the kitchen when he hit his head.      Heart arrhythmia certainly could be an etiology for this.  He also      has other reasons to have syncopal episodes such as his prolonged      opiate, as well as benzodiazepine use.  I agree with holding this.      We will continue to monitor him.  Certainly if it  is felt that he       meets criteria for pacemaker, we will have this done.  3. Renal failure - resolving per Dr. Arrie Aran.  4. Hypertension - mildly elevated watch well off of beta-blocker, as      well as ARB.  5. Diabetes - fairly well-controlled on Lantus.      Jake Bathe, MD  Electronically Signed     MCS/MEDQ  D:  01/08/2009  T:  01/09/2009  Job:  [id number]

## 2010-10-11 NOTE — Consult Note (Signed)
Barry [name], Barry [name]             ACCOUNT NO.:  [id number]   MEDICAL RECORD NO.:  [id number]          PATIENT TYPE:  INP   LOCATION:  5508                         FACILITY:  [facility]   PHYSICIAN:  Terrial Rhodes, M.D.DATE OF BIRTH:  1950/11/16   DATE OF CONSULTATION:  DATE OF DISCHARGE:                                 CONSULTATION   REASON FOR CONSULT:  Increased creatinine and hyperkalemia.   HISTORY OF PRESENT ILLNESS:  The patient is a 60 year old male with past  medical history of Wilms tumor status post left nephrectomy with  radiation started at the age of 3, recurrent acute renal failure on  chronic kidney disease likely due to the  use of ACE inhibitors and  ARBs, suspected serotonin syndrome and rhabdomyolysis suspected for the  use of Lyrica who was admitted to the hospital for altered mental  status.  During last admission in April 26 to 28 2010, his creatinine is  3.83, potassium is 7.5 and the creatine kinase 2869, after stopping  precipitating medications, giving rehydration as well as Kayexalate,  insulin, glucose, the patient's condition has significantly improved.  On last discharge, his creatinine 1.33, potassium 4, and creatine kinase  1588.  After discharge, he has stopped taking ACE inhibitor and ARBs,  but restarted the Lyrica about 1-2 weeks later because of diabetic  neuropathy.  In the outpatient clinic with Dr. Arrie Aran in this June  and July, his creatinine has fluctuated, ranged 1.55 to 2.18 and his  potassium 6.3 to 5.6, and his creatine kinase is 98 on December 03, 2008.  He  also has several episodes of syncopes lasting about 1 minute and had one  fall and hit his head.  Two days prior to the admission, he feels weak  with poor appetite and intake, the wife found him to become more sleepy  and confused, which is similar to his last episode in this April.  So,  he was brought to the [facility] ED where they found that his potassium  6.1 and creatinine  4.6, so he was given Kayexalate, calcium, glucose,  and insulin, and was transferred to the [facility] for further management.  When I saw the patient, he had been  alert and oriented, no complaints including fever, muscle pain,  shortness of breath, chest pain, or headaches.  He denies use of any  cocaine.  No history of trauma or injuries recently.   PAST MEDICAL HISTORY:  1. Wilms tumor status post left nephrectomy with radiation started at      the age of 3.  2. Chronic kidney disease, stage III.  Baseline creatinine 1.8 with a      right solitary kidney.  3. Hypertension.  4. Type 2 diabetes.  5. Hyperlipidemia.  6. History of pulmonary radiation fibrosis.  7. Scoliosis.  8. Chronic back pain.  9. Bipolar disorder with multiple suicide attempts in the past.  10.Gout.  11.Peripheral neuropathy.   HOME MEDICATIONS:  Clonazepam, Flomax, Lyrica, Vicodin, Lantus,  morphine, fish oil, and metoprolol.   ALLERGIES:  SULFA causes angioedema and renal failure,  AUGMENTIN causing  angioedema.   SOCIAL HISTORY:  He denies tobacco, alcohol, or drug use.   FAMILY HISTORY:  Noncontributory.   REVIEW OF SYSTEMS:  See HPI.   PHYSICAL EXAMINATION:  VITAL SIGNS:  Temperature 98.4, blood pressure  142/81, pulse 84, respirations 16, and O2 sat 95% on room air.  GENERAL:  The patient is in no acute distress.  HEENT:  Extraocular movements intact.  Pupils are reactive to light.  NECK:  Supple.  No JVD.  LUNGS:  Clear to auscultation bilaterally.  No wheezing or crackles.  HEART:  Regular rate and rhythm.  No murmur.  ABDOMEN:  Soft.  Bowel sounds positive.  No tenderness or distention.  EXTREMITIES:  No edema but spine has scoliosis.  NEURO:  Alert and oriented x3.  No focal neurological findings.   LABORATORY DATA:  Hemoglobin 10.6, white blood cells 6.3, and platelets  157.  Sodium 139, potassium 4.8, chloride 106, bicarb 28, creatinine  2.69, BUN 38, glucose  51, bilirubin is 0.4, alkaline phosphatase 67, AST  is 66, ALT 32, and calcium 8.9.  Albumin 3.5, total protein 6.2,  creatine kinase on admission 1788, now 20-77.  Urine sodium 13 and urine  creatinine 298.5.  Urine drug screening test positive for  benzodiazepines and opioids.  Chest x-ray: no acute findings.  CT of the  head: no acute findings.  Kidney ultrasound on September 22, 2008, shows a  right kidney cyst about 2.4 cm, no obstruction or stone.   ASSESSMENT AND PLAN:  The patient is a 60 year old white male with  multiple medical problems who was well known to Dr. Arrie Aran from  previous consult and outpatient followup for his hyperkalemia due to  acute renal failure likely secondary to use of ACE inhibitor and ARBs  causing altered mental status with rhabdomyolysis initially.  During the  followup, his creatinine was 1.35 on December 11, 2008, and the creatine  clearance is 82 mL/minute. He had increased the potassium with edema,  then started on Lasix.  1. Acute renal failure on chronic kidney disease.  He had altered      mental status with increased creatinine and hyperkalemia.  This is      likely due to the acute tubular necrosis due to dehydration and low-      grade rhabdomyolysis which may be due to the use of Lyrica.  After      admission, he was given aggressive rehydration, now he has good      urine output of about 2 L and his creatinine has been significant      dropped from 4.3 on admission to 2.69 now, and his potassium      dropped from 6.1 on admission to 4.8 now.  I agreed to stop all      nephrotoxic medication include Lasix and Lyrica, will continue      rehydration and monitor the urine output and BMET as well as to      check the renal ultrasound to rule out any stone or obstruction,      although this is less likely.  2. Hyperkalemia likely due to the acute renal failure on chronic      kidney disease. After giving Kayexalate, calcium, insulin, glucose,       and rehydration, his potassium has been dropped to 4.8 from 6.1.      We will continue to monitor the BMET.  3. Altered mental status.  The patient has a history of several  syncopes before and his acute renal failure may also contribute to      this.  The CT of head did not show any acute findings.  Now after      the correction of the dehydration, hyperkalemia and increased      creatinine, mental status has been back to the normal baseline.  4. Syncope.  The patient had 3 syncope before this admission and the      outpatient carotid Doppler was negative and 2-D echo report is      pending.  May need to have the cardiology evaluation and find out      the cause of syncope.  5. Type 2 diabetes.  A1c is 6.6, now on the Lantus and sliding scale.      Because the patient has episode of      hypoglycemia, need to closely monitor CBG.  6. Hypertension, now is well controlled on Lopressor and Flomax.  7. Anemia.  Hemoglobin is 10.6, which is his baseline.      Jackson Latino, MD  Electronically Signed     ______________________________  Terrial Rhodes, M.D.    ZY/MEDQ  D:  01/06/2009  T:  01/07/2009  Job:  [id number]

## 2010-10-11 NOTE — H&P (Signed)
NAME:  Barry [name], Barry [name] NO.:  [id number]   MEDICAL RECORD NO.:  [id number]          PATIENT TYPE:  EMS   LOCATION:  ED                           FACILITY:  [facility]   PHYSICIAN:  Wilson Singer, M.D.DATE OF BIRTH:  10-26-1950   DATE OF ADMISSION:  11/17/2007  DATE OF DISCHARGE:                              HISTORY & PHYSICAL   HISTORY:  This is a 60 year old man who had taken three 30-mg morphine  tablets approximately 36-40 hours ago because of back pain that he has  chronically and says that since this time, especially yesterday, he has  been sleeping and drowsy.  This morning his wife was unable to arouse  him and called 9-1-1, and he was brought into the emergency room with a  blood glucose that was significantly reduced at 46 at home.  He was  given D50 by the fire department, and now when he presents to the  emergency room.  He is more awake.  He took his usual dose of Lantus  yesterday of 30 units in the evening.  We are now asked to admit this  patient for hyperkalemia which was found in the emergency room, with a  potassium of 7.   PAST MEDICAL HISTORY:  Significant for:  1. Left nephrectomy in the age of 60 year old for Wilms tumor.  He now      has a single right kidney.  2. Type 2 diabetes mellitus, insulin dependent.  3. Scoliosis and chronic low-back pain.  4. Hypertension, currently on ACE inhibitor.  5. Bipolar disorder.  6. Dyslipidemia.  7. Previous history of shingles in the right lower back in November      2005, complicated by post herpetic neuralgia currently on Lyrica.   MEDICATIONS:  1. Morphine 30 mg tablets as required.  2. Flomax 0.4 mg daily.  3. Lisinopril 10 mg daily.  4. Hydrocodone/acetaminophen 7.5/500 one tablet every 6 hours.  5. Allopurinol 100 mg daily.  6. Colchicine 0.6 mg daily.  7. Vyvanse 50 mg daily.  8. Lyrica 150 mg t.i.d.   ALLERGIES:  None.   SOCIAL HISTORY:  He is married and has three children.  He lives  with  his wife in [place].  His does not smoke and does not drink alcohol.  He is currently retired.   REVIEW OF SYSTEMS:  Apart from the symptoms described above, there are  no other symptoms referable to all systems reviewed.   FAMILY HISTORY:  Noncontributory.   PHYSICAL EXAMINATION:  Temperature 97.7, blood pressure 141/71, pulse  83, respiratory rate 12-14, saturation 98%.  He is alert but somewhat  drowsy.  CARDIOVASCULAR:  Heart sounds are present and normal.  There  are no murmurs.  Jugular venous pressure is not raised.  RESPIRATORY:  Lung fields are clear.  ABDOMEN:  Soft, nontender, with hepatosplenomegaly.  NEUROLOGICAL:  Alert and orientated.  There are no focal neurologic  signs.  MUSCULOSKELETAL:  There is significant scoliosis.   INVESTIGATIONS:  Sodium 135, potassium 7.0, chloride 103, bicarbonate  22, glucose 138, BUN 29, creatinine 1.83 with a baseline typically  in  the 2.0 range.  Hemoglobin 12.6   IMPRESSION:  1. Hypoglycemia, resolved.  2. Hyperkalemia being treated currently.  3. Chronic kidney disease with a history of one kidney with previous      history of left nephrectomy in the age of 3 for Wilms tumor.  4. Scoliosis with history of chronic low-back pain.  5. Type 2 diabetes mellitus on insulin.  6. Bipolar disorder.   1. Admit.  2. Intravenous D5-normal saline.  3. Discontinue ACE inhibitor.  4. Start Norvasc for hypertension.  5. Monitor renal function and potassium.   I suspect this man may be able to be discharged home soon, but in the  meantime further recommendations will depend on the patient's hospital  progress.      Wilson Singer, M.D.  Electronically Signed     NCG/MEDQ  D:  11/17/2007  T:  11/17/2007  Job:  [id number]

## 2010-10-11 NOTE — H&P (Signed)
NAMEROGERIO, BOUTELLE NO.:  [id number]   MEDICAL RECORD NO.:  [id number]          PATIENT TYPE:  INP   LOCATION:  2605                         FACILITY:  [facility]   PHYSICIAN:  Michiel Cowboy, MDDATE OF BIRTH:  09-02-1950   DATE OF ADMISSION:  01/05/2009  DATE OF DISCHARGE:                              HISTORY & PHYSICAL   PRIMARY CARE PHYSICIAN:  Joycelyn Rua, M.D.   CHIEF COMPLAINT:  Altered mental status.  The patient is a 60 year old  gentleman with history of recurrent acute on chronic renal failure.  Suspect in the past __________ syndrome caused by Lyrica and  history of  diabetes as well as history of benzodiazepine abuse, bipolar disorder  and remote history of suicide attempts..   The patient was just discharged on April 29.  Since then he had been  seen by Dr. __________, by Dr. Jayme Cloud and by Dr. Lenise Arena.  He has been  having troubles with occasional syncopal episodes and Dr. Jayme Cloud has put  him on a monitor.  At the time of discharge he was instructed to stop  his Lyrica, his ACE inhibitor and ARB.  He is still taking about 1  Lyrica a day.  He states if nothing else, it helps him for his pain.  I  am not quite sure if he continues to take Losartan.  He does not know.  On notes from his primary care Makara Lanzo apparently they are still on his  list as well as Lasix.   Per his wife, he had been doing fairly well yesterday.  He had a lot of  pain on Sunday and took morphine sulfate as well as hydrocodone but  since then really tried not to take any pain medications.  Per his wife,  he has been trying to stay away from narcotics as much as possible.   Today he woke up and started to feel somewhat extra sleepy.  Per his  wife throughout the day he had becoming more and more  lethargic.  He  has not been eating or drinking anything the whole day.  When she  brought him into the primary care physician, he was seen by Dr.  __________.  He has been  confused throughout the whole day and somewhat  staggering which is similar to his episode in April.  He was sent then  to [facility] ER where he was found to have elevated potassium to 6.1  and elevated creatinine at which point, Neurology hospitalist was  called.   In the Emergency Department, the patient received Kayexalate, calcium,  and glucose.   REVIEW OF SYSTEMS:  The patient denies any chest pain, shortness of  breath.  He had a little bit of nausea but no vomiting.  No diarrhea.  Per wife, he has been somewhat warm to day and maybe had a low grade  fever up to 100.  In the office it was documented at 93.  At  [facility]  he was afebrile.   Per nursing staff, he has not had as much urine output today.   PAST MEDICAL HISTORY:  Significant  for:  1. History of chronic back pain.  2. Diabetes.  3. Hypertension.  4. Gliosis.  5. Bipolar disorder.  6. Chronic kidney disease, baseline creatinine  1.5.  7. 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  8. Multiple suicide attempts in the past.  Currently denies.  9. Chronic benzo use.  10.Peripheral neuropathy.  11.Dyslipidemia.  12.History of pulmonary radiation fibrosis.   ALLERGIES:  No known drug allergies.   SOCIAL HISTORY:  The patient denies smoking, drinking and denies abusing  drugs.  States that he currently has no suicidal ideations, no homicidal  ideations and has not taken extra dose of either of his medications.   FAMILY HISTORY:  Noncontributory.   MEDICATIONS:  1. __________over the counter as needed for anxiety.  2. Klonopin 1 mg three times a day.  3. Flomax 0.4 mg daily.  4. Lyrica 200 mg daily.  5. Lantus 30 units daily.  6. Morphine sulfate 300  mg twice a day.  He says  he tries to not      take it.  7. Vicodin as needed for pain. He also tries to not take it.  8. Metoprolol 25 mg twice a day.  9. Losartan, unsure if he is taking it anymore.  10.Lasix 40 mg daily, unsure if he is taking  it.  11.He stopped his simvastatin.   PHYSICAL EXAMINATION:  VITALS:  Temperature 98.4, blood pressure 134/70,  pulse 94, respirations 14, saturating 100% on 2 liters.  The patient appears to be currently in no acute distress but somewhat  somnolent.  HEAD:  Nontraumatic.  Very dry mucous membranes.  Decreased skin turgor.  Pupils equal and reactive to light.  Appear to be pinpoint.  HEART:  Regular rate and rhythm.  No murmurs appreciated.  LUNGS:  Clear to auscultation bilaterally.  ABDOMEN:  Soft, nontender, nondistended.  LOWER EXTREMITIES:  No clubbing, cyanosis or edema.  NEUROLOGIC:  The patient appears to be grossly intact.  The patient has  asterixis.   LABS:  White blood cell count 7.8, hemoglobin 10.6, sodium 135,  potassium 6.1, creatinine 4.3, up from baseline 1.3, AST slightly at 60,  otherwise LFTs  normal.  Sodium is 13.   EKG showing slightly peaked T-waves.  CK 1700, CK-MB 30.  Troponin  negative.   Chest x-ray unremarkable.  CT of head is negative.   ASSESSMENT/PLAN:  This is a 60 year old gentleman again here with renal  failure, elevated total CK although not quite in the rhabdomyolysis  range, elevated  potassium and altered mental status.  1. Acute on chronic renal failure.  I have spoke with Dr.      Kathrene Bongo and they will him in the morning.  At this point there      is no acute indication for dialysis.  Will repeat BMET and see      where his potassium and creatinine is trending.  Will give      aggressive IV fluids as the patient has recovered with this in the      past.  Reorder renal ultrasound and see if there is any evidence of      obstruction post Foley.  Continue Flomax.  Send UA and urine      culture given low grade fever.  There is some worry whether Lyrica      could potentially increase a little bit his potassium or cause      serotonin syndrome.  Will hold this. Deferr to renal if they  think      that this could be somewhat  contributing.  Will definitely hold      Losartan although again not sure if he is taking it.  Ask the wife      to bring his  home medications to make sure to give him  correct      medicines here. Order SPEP, UPEP.  2. Altered mental status most likely secondary to renal failure.  CT      scan of the head is unremarkable which is encouraging.  Will give      small dose of Narcan and see if this helps since he does appear to      have somewhat pinpoint pupils and some symptoms that would be      consistent with high levels of opioids.  Will also use urine      toxicity screen.  Renal failure is most likely contributor to why      his mental status is down.  Will make n.p.o. until he is able to      eat and pass bedside swallow eval.  Hold Lyrica.  Hold clonazepam.      Will give Ativan p.r.n. once symptoms wear off and then slowly he      could restart his Ativan.  Hold morphine sulfate for now until we      are sure this is not caused by narcotic over use.  The patient      denied overwhelmingly any intentional  overuse of narcotics.  3. Diabetes.  Will decrease his dose of Lantus while he is in  renal      failure as this could accumulate.  Continue sliding scale.  4. Prophylaxis.  Protonix plus SCDs.  5. Slight anemia.  Will check anemia panel and hemoccult stools.  6. History of syncopal events.  Will check orthostatics and give IV      fluids.  He is to continue to wear his monitor and will watch him      on telemetry, cycle his cardiac markers.  Since he has not had an      echogram will order 2-D echo.  7. Of note in the past he has had a slightly low TSH.  Will recheck      and check his T3 and T4.      Michiel Cowboy, MD  Electronically Signed     AVD/MEDQ  D:  01/05/2009  T:  01/05/2009  Job:  [id number]   cc:   Joycelyn Rua, M.D.  Dr.  __________

## 2010-10-11 NOTE — H&P (Signed)
NAMEARLISS, FRISINA NO.:  [id number]   MEDICAL RECORD NO.:  [id number]          PATIENT TYPE:  INP   LOCATION:  2609                         FACILITY:  [facility]   PHYSICIAN:  Nestor Ramp, MD        DATE OF BIRTH:  [birth year]   DATE OF ADMISSION:  09/21/2008  DATE OF DISCHARGE:                              HISTORY & PHYSICAL   PRIMARY CARE Cheyenna Pankowski:  Patrice Paradise PA at [facility]   CHIEF COMPLAINT:  Altered mental status.   HISTORY OF PRESENT ILLNESS:  The patient is a 60 year old male with past  medical history significant for chronic low back pain, on chronic  narcotics; diabetes mellitus type 2; and 5-6 prior suicide attempts, who  presented to the ED with altered mental status.  History is provided  primarily by the patient's wife because the patient appears to difficult  to understand at the time of exam.  Wife states the patient was in his  normal state of health up until early this morning.  Wife saw the  patient in bed acting confused and delusional, somnolent, and unable to  ambulate independently.  The patient stated that he is nauseous and wife  took to the bathroom.  She then laid him back on the bed and called a  friend, who is a Engineer, civil (consulting), who checked his blood sugar and it was normal,  also checked a temperature, which is 101 degrees Fahrenheit.  They, then  called EMS.  The patient states that he did not take any more of his  medicines that were prescribed.   ALLERGIES:  No known drug allergies.   MEDICATIONS:  1. Clonazepam 1 mg t.i.d.  2. Cozaar 50 mg daily.  3. Flomax 0.4 mg p.o. daily.  4. Lyrica 200 mg p.o. t.i.d.  5. Vicodin 7.5/500 mg 1 p.o. q.6 h. p.r.n. pain.  6. Allopurinol 100 mg p.o. daily.  7. Lantus 30 units injected subcutaneously daily.  8. Lisinopril 10 mg p.o. daily.  9. Morphine sulfate 30 mg p.o. b.i.d.  10.Benadryl 25 mg p.o. t.i.d.  11.Fish oil.  12.Folic acid.  13.Glycerin suppository p.r.n. constipation.   PAST MEDICAL HISTORY:  1. Chronic back pain.  2. Wilms tumor, status post resection and radiation.  3. Scoliosis.  4. 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  5. Hyperkalemia.  6. Diabetes mellitus type 2, insulin dependent.  7. Bipolar disorder with history of multiple suicide attempts.  8. Anemia.  9. Hypertension.  10.Dyslipidemia.  11.History of shingles.   PAST SURGICAL HISTORY:  1. Resection of left kidney Wilms tumor.  2. Radiation to the left chest and abdomen.   FAMILY HISTORY:  Noncontributory.   SOCIAL HISTORY:  The patient is married, and has 3 children.  He does  not drink, use tobacco or illicit drugs.   REVIEW OF SYSTEMS:  Complete review of systems is otherwise negative.   PHYSICAL EXAMINATION:  VITAL SIGNS:  T-max 101.2, pulse 100, respiratory  rate 14, blood pressure 122/72, and pulse ox 100% on Venturi mask.  GENERAL:  Somnolent, arousable, not alert or oriented.  Follows  commands.  HEENT:  Pharynx pink and moist.  Pupils 3 mm and reactive.  No  lymphadenopathy.  CARDIOVASCULAR:  Tachycardic, otherwise regular without murmurs, rubs,  or gallops.  RESPIRATORY:  Coarse upper airway sounds throughout.  Decreased breath  sounds at the left.  ABDOMEN:  Sore scar from prior surgery, otherwise soft, nontender, and  nondistended.  Positive bowel sounds.  No hepatosplenomegaly.  GU:  Testes descended bilaterally.  MUSCULOSKELETAL:  No edema.  NEUROLOGIC:  Able to move all extremities.  Deep tendon reflexes 1+, had  elbow and ankle normal Brudzinski.  SKIN:  Warm intact and no rashes.   LABORATORIES AND STUDIES:  1. Head CT; no acute intracranial abnormality.  2. Chest x-ray; patchy bilateral left lower lobe densities likely      atelectasis versus pneumonia.  3. CBC:  WBC 7.9, hemoglobin 9.0, hematocrit 31.8, and platelets 165.  4. BMET:  Sodium 133, potassium greater than 7.5, chloride 103, CO2 of      23, BUN 34, creatinine 3.83, and glucose 101.  5.  CBG 94.  6. Alcohol level 6.  7. ABG; pH 7.277, pCO2 of 50.4, pO2 of 43.0, and bicarb 23.5.  8. Urine drug screen positive for opiates and positive for benzos.  9. Lactic acid 0.9.  10.Urinalysis negative.   ASSESSMENT AND PLAN:  The patient is a 60 year old male with altered  mental status and fever, also with hyperkalemia.  1. Altered mental status:  This is expansive differential diagnosis on      top include drug overdose, acute infection, or encephalopathy.      Head CT was negative.  Unable to do LP at this time due to      scoliosis.  I will have IR do it under fluoroscopy.  We have blood      cultures pending x2 as well as urine culture.  UA was negative.      Chest x-ray showed questionable pneumonia.  We will hold off on      antibiotics until LP is performed.  We will get sodium or ammonia      level, B12, RPR, TSH.  We will hold any meds that could possibly      causing altered mental status.  2. Hyperkalemia:  Stabilized in the ED with calcium, insulin, glucose,      and albuterol.  The patient will be get Kayexalate p.o.  I will      recheck a BMET in the morning.  3. Acute-on-chronic renal failure:  The appears like he might be      slightly dry.  We will give normal saline 500 mL bolus and then D5      half-normal saline at 150 mL an hour.  We will hold Cozaar and      lisinopril.  4. Diabetes mellitus type 2.  We will hold Lantus and place the      patient on moderate sliding scale insulin.  5. Hypertension.  We will hold Cozaar and lisinopril given kidney      function.  6. Bipolar disorder.  We will hold medications for now.  7. Fluids, electrolytes, nutrition/gastrointestinal:  N.p.o. until      mental status resolves.  D5 half-normal saline at 150 mL an hour.      Recheck potassium.  8. Prophylaxis:  SCDs.  9. Disposition:  Pending resolution of altered mental status.       Angelena Sole, MD  Electronically Signed      Roxine Caddy  Jennette Kettle, MD   Electronically Signed    WS/MEDQ  D:  09/21/2008  T:  09/22/2008  Job:  [id number]

## 2010-10-14 NOTE — Discharge Summary (Signed)
Barry, [name] NO.:  [id number]   MEDICAL RECORD NO.:  [id number]          PATIENT TYPE:  INP   LOCATION:  4742                         FACILITY:  [facility]   PHYSICIAN:  Hettie Holstein, D.O.    DATE OF BIRTH:  [birth year]   DATE OF ADMISSION:  04/22/2005  DATE OF DISCHARGE:  04/25/2005                                 DISCHARGE SUMMARY   PRIMARY CARE PHYSICIAN:  Dr. Royanne Foots in [place], [place], fax  number [phone].   REASON FOR ADMISSION:  Persistent nausea, retching and confusion.   DIAGNOSES AT TIME OF DISCHARGE:  1.  Acute mental status change.  Clinically, it suspected that this perhaps      related to hypoglycemic event in the context of well-controlled diabetes      with Lantus and subsequent poor oral intake.  He has undergone thorough      neurologic evaluation including an MRI of the brain that has revealed no      evidence of infarct; however, there is advanced-stage atrophy noted;      otherwise other imaging studies are unremarkable.  2.  Acute gastritis and Hemoccult-positive stool with stable hemoglobin.  It      is noted that he has undergone colonoscopy by Dr. Thedore Mins at [facility]      in the outpatient setting prior.  I have discussed this with Dr. Leona Singleton      and he is seeing Barry [name] today and will coordinate gastroenterology      evaluation in the outpatient setting.  3.  Diabetes mellitus, fairly well-controlled with hemoglobin A1c at 6.6.      We will recommend continuing his regimen as he was on prior to arrival      including nightly Lantus, though we will decrease his Lantus dose until      his oral intake improves to 20 instead of 30 and he can resume 30, once      he is taking full meals.  4.  Chronic back pain, status post Wilms tumor and radiotherapy as a child,      on chronic narcotics for recurrent back pain managed by his primary care      physician.   MEDICATIONS ON DISCHARGE:  The patient is recommended to  continue his  medication as he was on before including atenolol, TriCor, Actos, Norvasc,  Klonopin, Baclofen, Vicodin, Vytorin, Lantus with adjustments as described  above and Protonix as the only new medication at 40 mg daily.   FOLLOWUP:  He has an appointment with Dr. Leona Singleton at 1:30 p.m.   HISTORY OF PRESENTING ILLNESS:  For full details, refer to H&P as dictated  by Dr. Isidor Holts; however, briefly, Barry [name] is a 60 year old male  with past medical history for diabetes and chronic low back pain with  scoliosis, status post nephrectomy for Wilms tumor and radiation therapy  with a solitary right kidney and chronic renal insufficiency, who presented  with nausea, vomiting and retching with episode of confusion.  No data in  regards to his capillary blood glucose was available.   HOSPITAL  COURSE:  In any event, Barry [name] was admitted and his hospital  course was that of resolution.  He underwent neurologic evaluation without  evidence of acute TIA or cerebrovascular accident.  His laboratory data at  time of discharge revealed his LDL to be 102, hemoglobin U2V 6.6, his  creatinine was 1.9 and hemoglobin was 11.2.  Barry [name] is being discharged  in medically stable condition to follow up with his primary care physician.      Hettie Holstein, D.O.  Electronically Signed     ESS/MEDQ  D:  04/25/2005  T:  04/25/2005  Job:  [id number]   cc:   Royanne Foots MD  Fax #[phone]

## 2010-10-14 NOTE — Discharge Summary (Signed)
Barry [name], Barry [name] NO.:  [id number]   MEDICAL RECORD NO.:  [id number]          PATIENT TYPE:  INP   LOCATION:  2609                         FACILITY:  [facility]   PHYSICIAN:  Barry Ramp, MD        DATE OF BIRTH:  05/08/1951   DATE OF ADMISSION:  09/21/2008  DATE OF DISCHARGE:  09/23/2008                               DISCHARGE SUMMARY   PRIMARY CARE PHYSICIAN:  Dr. Royanne [name] at [facility].   DISCHARGE DIAGNOSES:  1. Altered mental status, resolved.  2. 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  3. Hyperkalemic acute on chronic renal failure.  4. Elevated creatine kinase.  5. Hypotension.  6. Diabetes mellitus type 2.  7. Bipolar disorder, then depression with multiple suicide attempts.  8. Chronic benzodiazepine use.  9. Chronic back pain.  10.Peripheral neuropathy.  11.Dyslipidemia.  12.Scoliosis.  13.History of Wilms tumor status post left nephrectomy with radiation      therapy at the age of [age].  14.History of pulmonary radiation fibrosis.   DISCHARGE MEDICATIONS:  1. Clonazepam 1 mg by mouth t.i.d.  2. Metoprolol 25 mg by mouth b.i.d.  3. Lantus 30 units daily.  4. Flomax 0.4 mg by mouth daily.  5. Fish oil 1000 mg daily.  6. Folic acid daily.   PROCEDURES:  1. Renal ultrasound, right kidney.  Impression:  No right renal      hydronephrosis or diagnostic renal calculus.  A right kidney cyst      was noted, measuring 2.4 cm.  2. Chest x-ray on September 22, 2008.  Impression:  Hypoventilation with      bibasilar atelectasis.  Progression of bilateral airspace disease      may represent superimposed edema.  3. CT of the head without contrast on September 21, 2008.  Impression:  No      definite acute or focal abnormality.  Diagnostic LP under      fluoroscopic guidance on September 22, 2008.  Opening pressure of 37 cm      of water, tolerated well.   LABORATORY DATA:  CSF studies and culture were negative.  Upon  discharge, sodium 139,  potassium 4.0, chloride 104, CO2 24, glucose 182,  BUN 16, and creatinine 1.33.  Albumin 2.7, calcium 8.9, and phosphorus  2.8.  Urine and urine culture were negative.  B12 390.  TSH 0.547.  RPR  nonreactive.  Hepatitis B surface antigen negative.  Hemoglobin A1c 6.7.  Ammonia 28.  Lactic acid 0.9.  Alcohol 6.  Urine drug screen positive  for benzodiazepines and opiates.  CK trend 28-69 to 28-64 to 15-88.   BRIEF HOSPITAL COURSE:  Please see H and P for further details of  admission.  Briefly, Barry [name] is a 60 year old male that was admitted  for altered mental status.  1. Altered mental status.  The patient's altered mental status was      thought to be secondary to mild serotonin-like syndrome secondary      to overdose of Lyrica in the setting of chronic renal      insufficiency.  The patient did present  febrile, so other      infectious etiology was excluded.  Urine, chest x-ray, and CSF      studies were all negative.  The patient also had a negative CT.      Other studies including alcohol level, urine drug screen, ammonia,      and lactic acid were within normal limits or no different than      expected.  TSH, RPR, vitamin B12 were also negative.  The patient      was noted to have a history of suicide attempts during which time      he used to take excessive narcotics.  The patient was given Narcan      with little help in the emergency department.  He also later denied      overdose.  2. Hyperkalemic acute renal failure and chronic renal kidney disease.      Renal was consulted and appreciated.  The patient was noted to have      a baseline creatinine of 1.8, a history of Wilms tumor status post      left nephrectomy with radiation therapy at the age of [age] as well      as diabetes mellitus type 2 times about 5 years.  Upon      presentation, the patient had a creatinine of 3.83.  He had good      urine output.  His acute renal failure was thought to be likely      secondary  to ischemic acute tubular necrosis given the fact the      patient presented with hypotension, has a solitary kidney, and had      concomitant used of an ACE and an ARB along with Lyrica.  His ACE,      ARB, and Lyrica were all discontinued.  The patient was also      aggressively rehydrated with normal saline at 250 mL/hour.  His      kidney function did go back to baseline and he was discharged with      a creatinine of 1.33.  3. Hyperkalemia.  The patient's potassium remained elevated despite 30      grams of Kayexalate, insulin, and bicarb.  His EKG remained normal      sinus rhythm with no ST or T-wave changes that would have been      consistent with hyperkalemia.  He had no thrombocytosis or      leukocytosis that would account for pseudo or hyperkalemia.  Renal      was consulted and recommended continuing Kayexalate and getting a      one-time dose of Lantus.  This resolved his hyperkalemia and he did      not need dialysis.  4. Fever.  The patient was febrile upon admission but became afebrile      fairly quickly.  This was thought to be secondary to possible mild      serotonin syndrome rather than infection.  All studies were      negative.  We did hold antibiotics.  5. Elevated creatinine kinase, possibly secondary to rhabdomyolysis.      As the patient was noted to have some myoclonic jerks upon      admission, he was aggressively hydrated with maintenance IV fluids      at 250 mL/hour.  His creatinine kinase was followed.  Our goal was      to have his CK less than 700 before discharge but the patient  requested discharge.  At that time, his CK had showed marked      improvement.  6. Hypotension.  The patient was hypotensive on the night after      admission.  All of his blood pressure medicines were held and he      responded well to fluids.  His hypotension did resolve and      metoprolol was added prior to discharge for his antihypertensive      medication.  7.  Endocrine.  The patient has a history diabetes mellitus type 2.      His A1c was 6.7.  He was placed on a sliding scale throughout his      stay and discharged on his home medication Lantus.  8. Psych.  The patient has a history of bipolar disorder and multiple      suicide attempts.  He is chronically on benzos.  We did continue a      low dose of benzo during his stay, so that he did not have a      withdrawal.  Also noted the patient was not on an anti-psychotic at      this time and suggest that this be followed up by his primary care      physician.  The patient denied any suicidal ideations, thoughts,      plans, or recent suicide attempts.   FOLLOWUP:  The patient will follow up with his primary care physician,  Dr. Leona Singleton at [facility] on Sep 26, 2008 at 1:50 p.m.  Please fax all of this information to Dr. Leona Singleton, fax number is [phone].  We did ask the patient to stop taking his Cozaar, lisinopril, and  Lyrica.      Helane Rima, MD  Electronically Signed      Barry Ramp, MD  Electronically Signed    EW/MEDQ  D:  09/24/2008  T:  09/25/2008  Job:  [id number]

## 2010-10-14 NOTE — Discharge Summary (Signed)
NAMEAXZEL, ROCKHILL NO.:  [id number]   MEDICAL RECORD NO.:  [id number]          PATIENT TYPE:  INP   LOCATION:  1611                         FACILITY:  [facility]   PHYSICIAN:  Michaelyn Barter, M.D. DATE OF BIRTH:  1951-04-07   DATE OF ADMISSION:  11/27/2006  DATE OF DISCHARGE:  11/28/2006                               DISCHARGE SUMMARY   PRIMARY CARE DOCTOR:  Dr. Royanne Foots of [place], [place].   FINAL DIAGNOSES:  1. Asymmetric arthropathy.  2. Left side weakness.  3. Dehydration.  4. Steroid-induced hyperglycemia.   PROCEDURES:  1. X-ray of the left shoulder, completed November 26, 2006.  2. MRI of brain without contrast November 26, 2006.  3. MRI of the cervical spine without contrast November 26, 2006.   HISTORY OF PRESENT ILLNESS:  Mr. Barry [name] is a 60 year old gentleman who  developed emesis after eating dinner.  He experienced at least six  episodes of emesis.  He indicated that his mouth began to feel dry, and  his speech appeared to be somewhat slurred.  The following day, his  joints began to hurt bilaterally.  This was accompanied by muscle pain  involving both the upper and lower extremities.  The pain progressed,  and by June 28th, the symptoms had improved on the right side but became  worse on the left.  He indicated that the pain had become severe in the  left shoulder, wrist and left hip, and the pain was exacerbated by  movement.   PAST MEDICAL HISTORY:  Please see that dictated by Dr. Isidor Holts.   HOSPITAL COURSE:  1. Asymmetric arthropathy.  An x-ray of the patient's left shoulder      was done on November 26, 2006.  It revealed no acute abnormalities.  An      MRI of the brain without contrast was completed on November 26, 2006,      which revealed no acute abnormality.  MRI of the cervical spine      without contrast revealed normal alignment, no fracture or mass      lesion.  The cord had normal signal.  Rheumatology was consulted.      Dr.  Kellie Simmering saw the patient.  He indicated that the patient was      suffering from asymmetric oligoarticular arthritis, and he      suspected that gout was the underlying cause.  He made adjustments      to the patient's prednisone which had been initiated and      recommended that cultures be started b.i.d.  He also scheduled the      patient for follow-up appointment on November 29, 2006.  At the date of      discharge, the patient indicated that his pain had resolved.  2. Left-sided weakness.  Again, MRI of the patient's brain had been      completed and was found to be negative for any acute events. By the      date of discharge, the patient had no additional complaints.  3. Dehydration.  The patient was started on IV fluid hydration  at the      time of admission, and by the day of discharge his dehydration had      resolved.  4. Diabetes mellitus.  The patient's sugars were elevated.  This may      have secondary to steroids the patient has been placed on. Attempts      were made to control the patient's sugars.  By the date of      discharge, the patient indicated that he felt better, and he      requested to be discharged.  His temperature was 97.1, heart rate      71, respirations 20, blood pressure 154/89, sat is 98% on room air.   MEDICATIONS:  His medications at time of discharge consisted of:  1. Norvasc 5 mg p.o. daily.  2. Culture sent 0.6 mg b.i.d.  3. Nu-Iron 150 mg p.o. daily.  4. Protonix 40 mg p.o. daily.  5. Prednisone.  The patient was told take a tapering dose, and he was      also finally told to call Dr. Kellie Simmering for follow-up appointment.      Michaelyn Barter, M.D.  Electronically Signed     OR/MEDQ  D:  02/11/2007  T:  02/12/2007  Job:  [id number]

## 2010-10-14 NOTE — Discharge Summary (Signed)
NAMEJERRARD, Barry [name]             ACCOUNT NO.:  [id number]   MEDICAL RECORD NO.:  [id number]          PATIENT TYPE:  INP   LOCATION:  6731                         FACILITY:  [facility]   PHYSICIAN:  Barry [name], M.D.   DATE OF BIRTH:  1950/08/13   DATE OF ADMISSION:  12/31/2006  DATE OF DISCHARGE:  01/04/2007                               DISCHARGE SUMMARY   PRIMARY CARE PHYSICIAN:  Dr. Royanne [name].   DISCHARGE DIAGNOSES:  1. Angioedema secondary to Bactrim or Augmentin.  2. Acute on chronic renal failure.  3. Hyperkalemia.  4. Anemia with heme-positive stools.  5. Diabetes mellitus type 2.  6. Bipolar disorder.  7. Thyroid dysfunction.  8. Bradycardia   MEDICATIONS AT THE TIME OF DISCHARGE:  1. Lyrica 25 mg one tablet a day by mouth.  2. Kayexalate 15 g 1 tab two times a day by mouth.  3. Klonopin 1 mg 1 tab three times a day by mouth.  4. Lantus 20 units subcutaneously once in the evening daily.  5. Pepcid 20 mg 1 tab twice a day.  6. Wellbutrin SR 150 mg two times a day.  7. MS Contin 30 mg two times a day.  8. Prednisone taper as described.  9. EpiPen 0.3 mg.  10.Benadryl 25 mg 1-2 tabs every six hours as needed  11.Tricor 145 mg 1 tab every day.  12.Folic acid 400 mg 1 tab every day.  13.Vitamin D 1000 units.  14.Multivitamin 1 tablet a day   DISPOSITION:  Patient is sent home in a stable condition. He wants to  follow up with his own primary care physician in [place], Dr. Royanne [name].  A followup appointment with Dr. Leona [name] is to be arranged by patient  himself.  At the followup appointment, a CBC and a BMET is to be  performed to keep an eye on his hemoglobin as well as his renal function  and hypokalemia.  Also a repeat thyroid function test examination is  advised when he is clinically stable.  The patient is also supposed to  follow up with Dr. Elnoria [name] for a repeat colonoscopy for evaluation of  likely gastrointestinal bleeding.   PROCEDURES:  The patient  underwent an EGD procedure on January 04, 2007  which showed normal esophagus, normal stomach and a normal duodenum.  The patient also underwent a sigmoidoscopic procedure on the same day.  The findings were a 6 mm sessile polyp in the sigmoid.  The rectum was  normal, but the patient was unable to tolerate the procedure to the end.  He requested that the procedure be terminated because of pain.  Hence,  complete examination could not be obtained.  The plan is to schedule  colonoscopy with propofol as an outpatient in two weeks.   CONSULTATIONS:  Patient underwent a consultation with Dr. Jeani [name]  for heme-positive stool, and he underwent an endoscopic evaluation with  an EGD and colonoscopy for further workup on his problem of anemia.   BRIEF HISTORY AND PHYSICAL EXAMINATION:  Barry [name] is a 60 year old  white man with type 2 DM, hypertension, solitary kidney,  bipolar  disorder and severe scoliosis who presented to the ED on the day of  admission with complaint of tongue and lip swelling.  He was seen by his  PCP about 10 days ago for sacral decubitus ulcer and was referred to  orthopedics for I&D/curettage.  This was done on July 30 in [place] and  patient was prescribed antibiotics for potential sacral ulcer infection.  He was started on antibiotics, Bactrim and Augmentin, about three days  after which he started to feel funny and unwell.  He complained of  palpitations and slight right-sided chest pain following which early in  the morning on the day of admission he developed lip swelling, tongue  swelling and slight shortness of breath.  He denied any fever, chills or  productive cough.   PAST MEDICAL HISTORY:  His past medical history is significant for type  2 DM with an A1c of 7% from July 2008.  He has scoliosis, chronic low  back pain and hypertension.  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  He also  has  bipolar disorder and iron-deficiency anemia.  He has a history of  shingles of his right lower back in November 2005 that was complicated  by post-herpetic neuralgia.  He has a history of arthropathy with a  negative test on November 26, 2006 with negative ANA/ RF and normal C3 and  C4.  He also mentioned a history of gastritis with heme-positive stools.   PHYSICAL EXAMINATION:  VITAL SIGNS:  Temperature 98, blood pressure  111/70, pulse 90, respiratory rate 18, O2 sat 97% on room air.  GENERAL:  Not in acute distress.  EYES:  PERRLA with intact extraocular muscle movement.  ENT:  Oropharynx is clear.  Mild tongue and lower lip edema.  Airway  open.  No obstruction.  NECK:  Supple.  No adenopathy.  RESPIRATORY:  Air entry equal bilaterally.  Bilateral occasional  wheezes.  CARDIOVASCULAR:  Regular rate and rhythm.  No murmurs, rubs or gallops.  GASTROINTESTINAL:  Soft, nondistended, nontender.  Bowel sounds present.  RECTAL:  External hemorrhoids with normal rectal tone and brown stools.  EXTREMITIES:  No edema.  SKIN:  Sacral decubitus ulcer, almost healed.  No erythema or discharge.  MUSCULOSKELETAL:  Severe scoliosis.  NEUROLOGIC:  Alert and oriented x3.  Cranial nerves II-XII are intact.  Reflexes 2+ bilaterally.  Strength 5/5 bilaterally.  Gait could not be  assessed.  PSYCHIATRIC:  Was appropriate.   LABORATORY DATA:  At the time of investigation, he had a sodium of 140,  potassium of 6.5, chloride 12, bicarbonate 20, BUN 37 and a creatinine  of 4.56.  His blood sugar was 41.  He had an anion gap of 8 and a  calcium of 9.9.  His WBC was 8.9, platelets 213, hemoglobin 10 and  hematocrit of 30.   HOSPITAL COURSE BY PROBLEMS:  1. Angioedema:  Patient most likely developed angioedema secondary to      intake of Bactrim and Augmentin, and he was also on lisinopril.  We      stopped all these medications.  He remained clinically stable      throughout his hospital admission.  His  edema subsided with      steroids.  Initially we kept him on Solu-Medrol and we switched him      to prednisolone taper which he is supposed to complete at home. We  are sending him home with Epipen injections. Because there were      potentially three classes of medicines that contributed to his      angioedema -- sulfa drugs, penicillin, and ACE inhibitors-- it is      worth carefully reintroducing those classes of medicines back into      his regimen. Since the Bactrim and Augmentin were the last to be      introduced, the ACE inhibitors should be reintroduced first. Again,      this should be done very slowly and carefully. A referral to an      allergist may be warranted in the future.   1. Renal failure:  Patient most likely had acute on chronic renal      failure secondary to Bactrim.  His initial creatinine was 4.56.      Urinary sodium examination showed a level of 102, and we continued      gentle hydration to which he responded very well and gradually his      creatinine came down to near about his baseline level of 1.5 to      2.2. Again, a BMET will be checked in the outpatient setting.   1. Hyperkalemia:  He came in with a potassium of 6.5. We monitored his      BMET closely.  We gave him Kayexalate and also p.r.n. Lasix and      albuterol to which he responded very well.  His potassium at the      time of discharge was 4.8.   1. Type 2 diabetes mellitus:  His recent A1c was 7%, but he had a very      high blood glucose inpatient because of being on steroid treatment.      We just carried on with Lantus and closely monitored his CBG.   1. Thyroid dysfunction.  A repeat thyroid function test while in the      hospital showed a TSH of 0.049 and a free T3 of 2 and free T4 of      1.06 showing low TSH, low free T3 and borderline low free T4.  Our      evaluation is that he is either having a sick euthyroid or      secondary hypothyroidism.  His thyroid function needs to  be      retested once he is clinically stable.   1. Joint problems.  He has had multiple joint problems in the past and      was investigated with negative ANA and rheumatoid factor, also a      normal C3 and C4 during previous clinical encounters.  We suspected      that he could be having pseudogout in view of the below normal      thyroid function, but this needs to be addressed by his primary      care physician on an outpatient basis.   1. Diarrhea.  The patient developed diarrhea while in the hospital.      We did a stool microscopy and examination which came out to be      negative, and we also did a C. diff toxin test that also came out      to be negative. In view of his having diarrhea with positive fecal      occult blood, we planned a colonoscopy to rule out any mass, lesion      or inflammatory process. Unfortunately, he could not complete his  colonoscopy exam. A repeat colonoscopy is to be perfomed by Dr.      Elnoria [name].  This needs to be followed up by the primary care physician.   1. Anemia.  Patient came in with hemoglobin of 10 and his baseline is      about 9.4, but while in the hospital it went to as low as 8.1, so      we did a gastroenterology consult with Dr. Elnoria [name] who planned an EGD      as well as a colonoscopy.  The EGD results are normal, but      unfortunately as already mentioned, his colonoscopy could not be      completed because of discomfort.  This needs to be followed up by      primary care physician.  The patient is to undergo future      colonoscopy test by Dr. Elnoria [name] with propofol sedation.  He also      received 2 units transfusion while in the hospital following which      his hemoglobin level came up to 10.3.   1. Relative bradycardia. During the last day of his admission, the      night team noted bradycardia of about 50 per minute.  His heart      rate had previously been between 70-90 per minute.  The likely      explanation for this could be  because he dropped his heart rate      following fluid replacement and packed red blood cells or it could      be secondary to thyroid dysfunction.  This needs to be followed by      the primary care physician.   CONDITION AT THE TIME OF DISCHARGE:  Patient insisted to go home right  after colonoscopy.  Be he had had a significant discomfort during the  procedure, we wanted to keep him overnight, but he insisted we let him  go home.  His vitals at the time of discharge were temperature maximum  of 98.8, systolic blood pressure of 149/73, pulse of 52 and respiratory  rate of 17.  He was saturating at 99% on room air.   LABORATORY DATA AT THE TIME OF DISCHARGE:  Hemoglobin of 10.3, white  cell count 7.1, platelets 224, sodium 135, potassium 4.8, chloride 103,  bicarbonate 23, BUN of 28 and creatinine of 1.86.  His blood glucose  level was 215.      Zara Council, MD  Electronically Signed      Barry [name], M.D.  Electronically Signed    AS/MEDQ  D:  01/05/2007  T:  01/06/2007  Job:  [id number]   cc:   Barry [name], M.D.  Dr. Royanne [name]

## 2010-10-14 NOTE — H&P (Signed)
Barry [name], Barry [name]             ACCOUNT NO.:  [id number]   MEDICAL RECORD NO.:  [id number]          PATIENT TYPE:  INP   LOCATION:  4742                         FACILITY:  [facility]   PHYSICIAN:  Isidor Holts, M.D.  DATE OF BIRTH:  09/25/1950   DATE OF ADMISSION:  04/22/2005  DATE OF DISCHARGE:                                HISTORY & PHYSICAL   PRIMARY MEDICAL DOCTOR:  Unassigned.   CHIEF COMPLAINT:  Persistent nausea, vomiting, retching and confusion.   HISTORY OF PRESENT ILLNESS:  This is a 60 year old.  For past medical  history, see below.  Patient has known history of chronic back pain.  According to family, who provided the history (patient is sedated),  patient  had Thanksgiving dinner on April 20, 2005 felt fine all day, then,  between 4 p.m. and 6 p.m. on April 21, 2005 complained of an increasing  chronic back pain and started having recurrent heaves/vomiting, although  mainly regurgitated mucus, complained of abdominal pain and became acutely  confused.  There was no diarrhea, no fever, no one else in the family was  affected by similar symptoms.   PAST MEDICAL HISTORY:  1.  Diabetes mellitus.  2.  Scoliosis/chronic low back pain.  3.  Hypertension.  4.  Status post left nephrectomy at age 89 year for Wilms' tumor.  Status      post radiotherapy at that time.  Now has single right kidney.  5.  Hypoplastic lungs. Query.  6.  Bipolar disorder.  7.  Iron-deficiency anemia.  8.  Dyslipidemia.   MEDICATIONS:  1.  Atenolol 25 mg p.o. daily.  2.  Hydrocodone/APAP (5/500) one p.o. q.6 h.  3.  NovoLog Insulin via PEN.  4.  Antidepressants - it is not clear which.  5.  Iron tablets.  6.  Cholesterol pill/fish oil.   ALLERGIES:  No known drug allergies.   SYSTEMS REVIEW:  Unobtainable.   SOCIAL HISTORY:  Patient is separated.  Nonsmoker/nondrinker.  Has no  history of drug abuse.  He has three offspring all alive and well.  He has  one brother and one sister  both of whom have diabetes mellitus.  His mother  died in 10/17/1999 from advanced cancer.  His father has hypertension.   PHYSICAL EXAMINATION:  VITALS:  Temperature 97, pulse 92 per minute regular,  respiratory rate 20, BP 145/62 mmHg, pulse oximeter 91% on room air.  GENERAL:  Patient appears quite comfortable, heavily sedated with Ativan,  not short of breath at rest.  HEENT:  No clinical pallor.  No jaundice.  No conjunctival injection.  Throat is not visualized.  Neck is supple.  JVP not seen.  No palpable  lymphadenopathy.  No palpable goiter.  CHEST:  Clear to auscultation.  No wheezes, no crackles.  CARDIAC:  Heart sounds 1 and 2 heard, normal, regular, no murmurs.  ABDOMEN:  Full, soft and nontender.  There is no palpable organomegaly, no  palpable masses.  Normal bowel sounds.  Left nephrectomy scar is noted.  Patient also has hyperpigmented skin changes, likely radiation induced.  LOWER EXTREMITY:  No  pitting edema.  Palpable peripheral pulses.  MUSCULOSKELETAL SYSTEM:  Not formally examined.  CENTRAL NERVOUS SYSTEM:  No focal neurologic deficit elicitable although  exam is limited, secondary to sedation.   INVESTIGATIONS:  CBC:  WBC 5.6, hemoglobin 12.2, hematocrit 35.3, platelets  247.  Electrolytes:  Sodium 138, potassium 4, chloride 105, CO2 22, BUN 22,  creatinine 2, glucose 121.  LFTs appear normal with AST of 30, ALT 29,  alkaline phosphatase 45.  Serum osmolality is normal at 295.  Lipase is 23.  Acetaminophen level less than 10.  Salicylate level less than 4.  Troponin I  at point of care less than 0.05.   Chest x-ray/abdominal x-ray dated April 21, 2005 showed unremarkable  bowel gas pattern, mild peribronchial thickening, no focal airspace disease.   ASSESSMENT AND PLAN:  1.  Acute gastritis/persistent vomiting. Likely secondary to festive dinner.      We will keep patient nothing by mouth for now, hydrate with intravenous      fluids, administer antiemetics and  p.r.n. analgesics.   1.  Dehydration secondary to #1 above.  We will manage with intravenous      fluid hydration.   1.  Renal insufficiency, in patient with one kidney.  Baseline creatinine is      unknown.  There is likely a prerenal component secondary to #2 above. We      will follow renal indices.   1.  Diabetes mellitus.  This appears controlled at present.  We will manage      with sliding scale insulin coverage for now.   1.  History of iron-deficiency anemia.  Hemoglobin appears reasonable.   1.  Acute confusional state.  Likely secondary to #1 and #2, however, for      completeness we will do urine drug screen, blood cultures, urinalysis,      head CT scan to rule out intracranial pathology and observe for now.   1.  Exacerbation of chronic back pain.  We will give p.r.n. analgesics,      await urinalysis to rule out urinary tract infection.   1.  Bipolar disorder.  Patient may possibly have had a manic episode.  We      shall need to re-evaluate when he becomes more awake.  Meanwhile, we      will restart pre-admission psychotropic medications when and if these      are known.  If this is not feasible, he may require psychiatric re-      evaluation.  Further management will depend on clinical course.      Isidor Holts, M.D.  Electronically Signed     CO/MEDQ  D:  04/22/2005  T:  04/22/2005  Job:  [id number]

## 2010-11-15 HISTORY — PX: BACK SURGERY: SHX140

## 2010-11-15 IMAGING — CT CT HEAD W/O CM
1 series · 16 of 30 positions shown, 20 images · non-contrast
Comparison: 09/21/2008.

CLINICAL DATA: Syncope.  Fever.  Elevated potassium.

CT HEAD WITHOUT CONTRAST
TECHNIQUE: Contiguous axial images were obtained from the base of
the skull through the vertex without contrast.

[Series 2: head 4.8 h37s · axial · 0.48mm/px · z∈[+1277,+1433]mm · 16 of 36 slices shown, 20 images]
[im 2/36  brain]
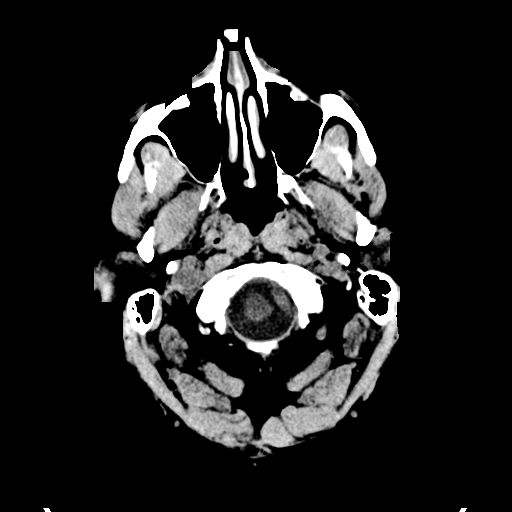
[im 2/36  bone]
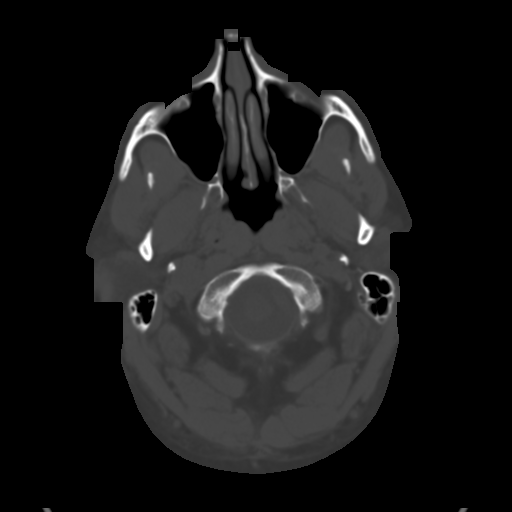
[im 4/36  brain]
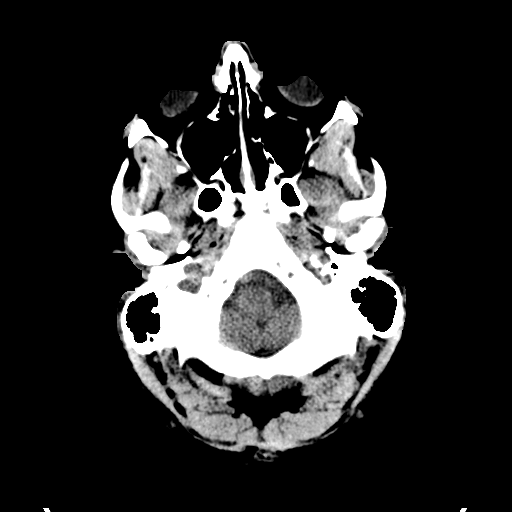
[im 7/36  brain]
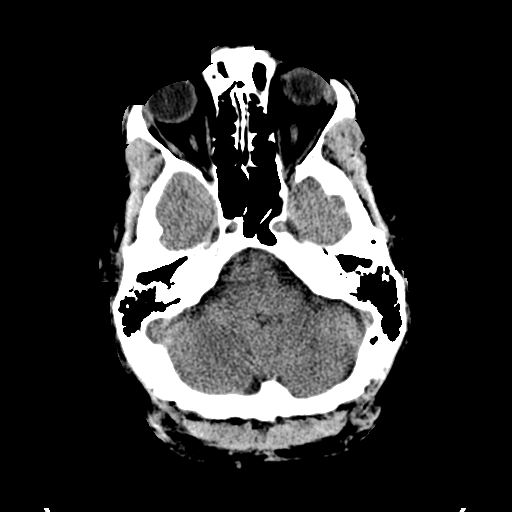
[im 9/36  brain]
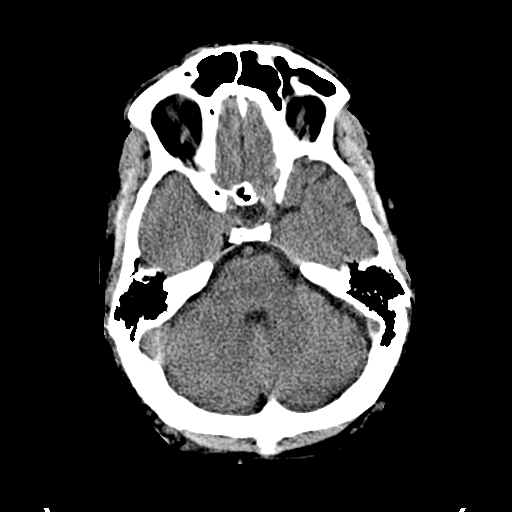
[im 10/36  brain]
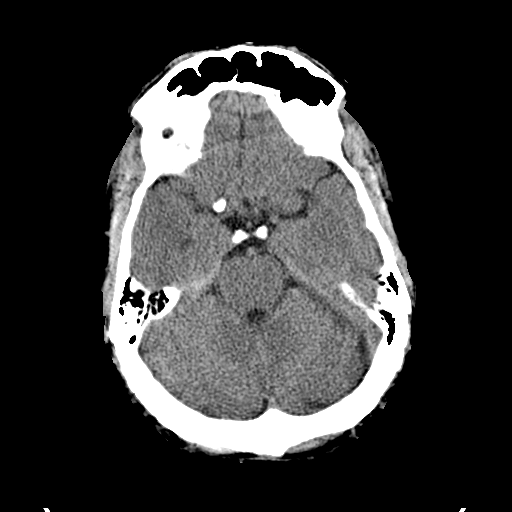
[im 10/36  bone]
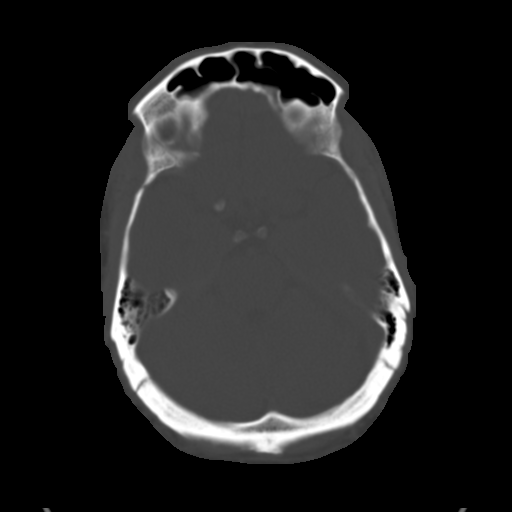
[im 13/36  brain]
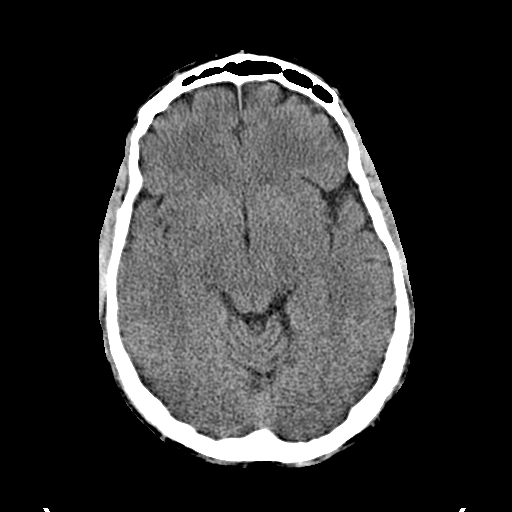
[im 15/36  brain]
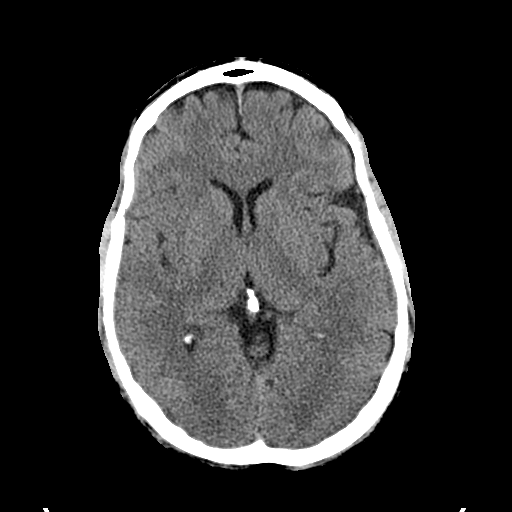
[im 17/36  brain]
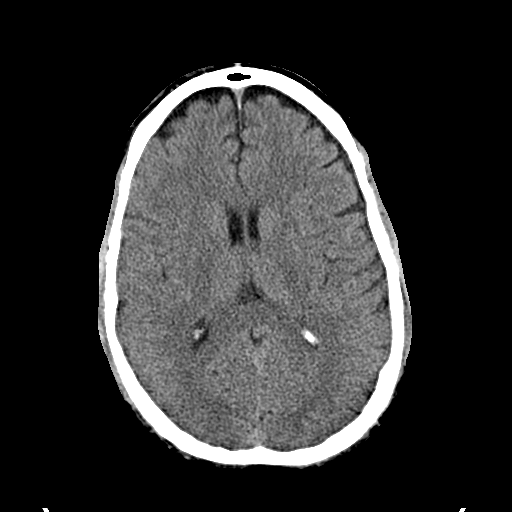
[im 19/36  brain]
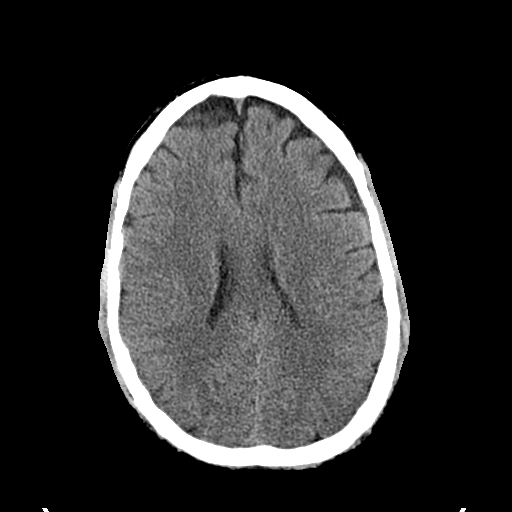
[im 19/36  bone]
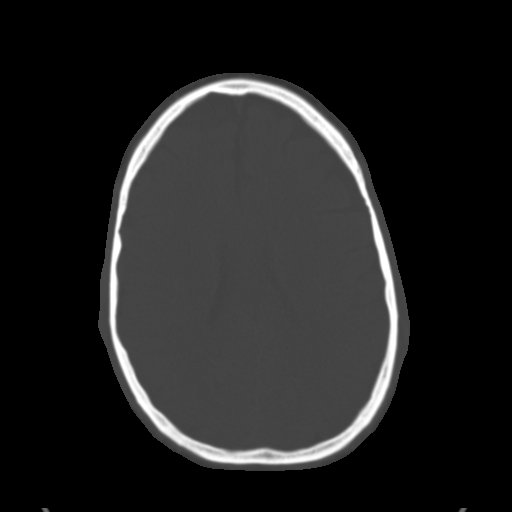
[im 21/36  brain]
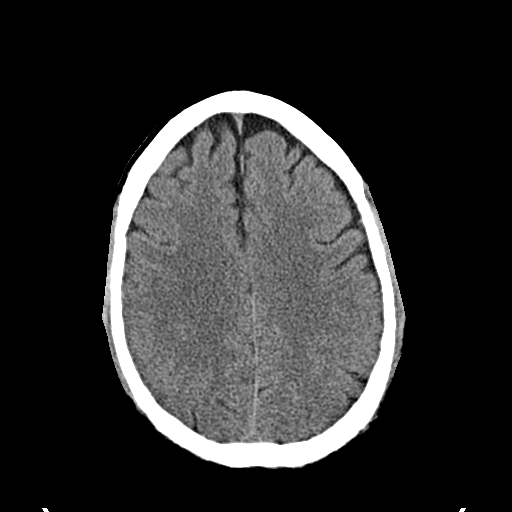
[im 23/36  brain]
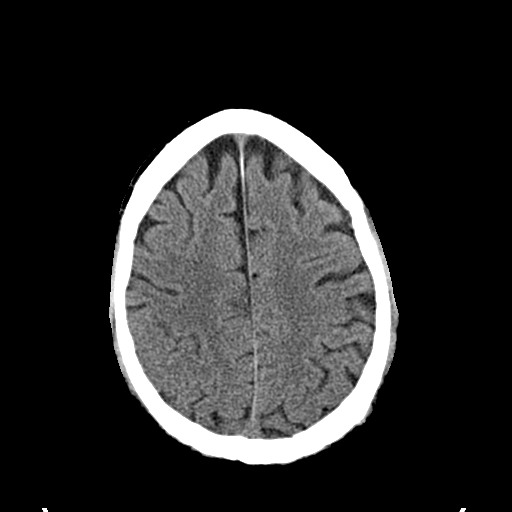
[im 26/36  brain]
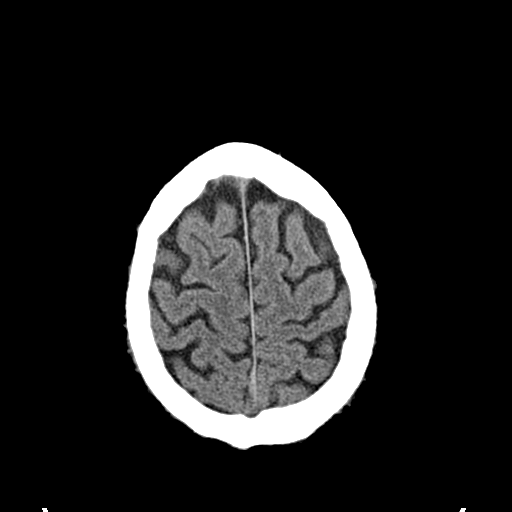
[im 27/36  brain]
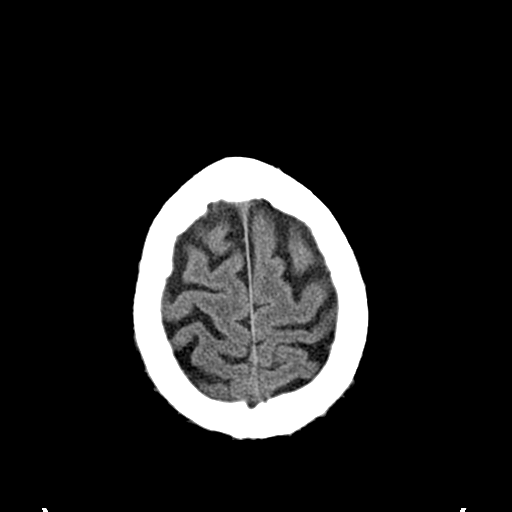
[im 27/36  bone]
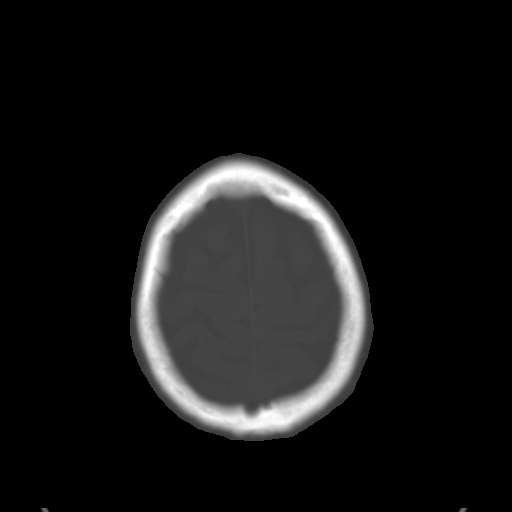
[im 29/36  brain]
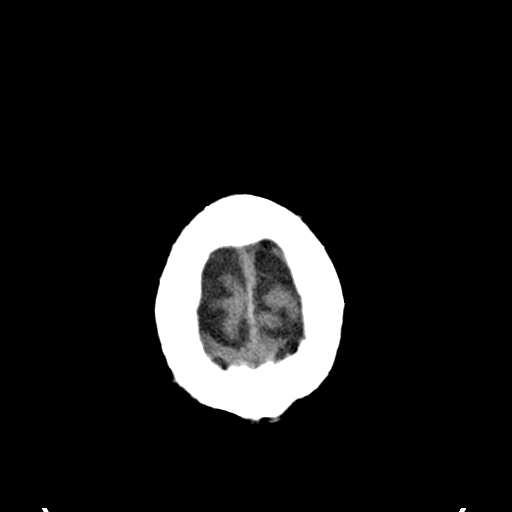
[im 32/36  brain]
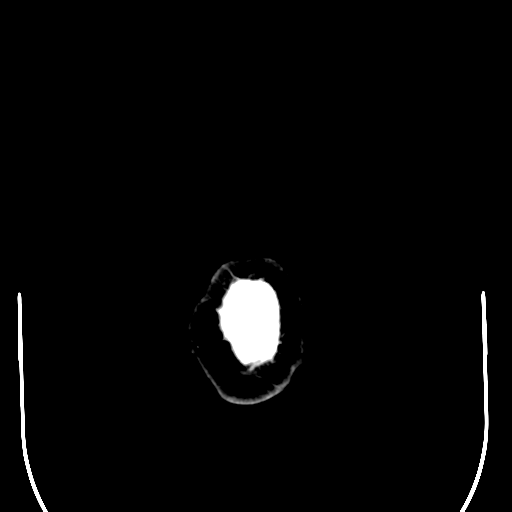
[im 34/36  brain]
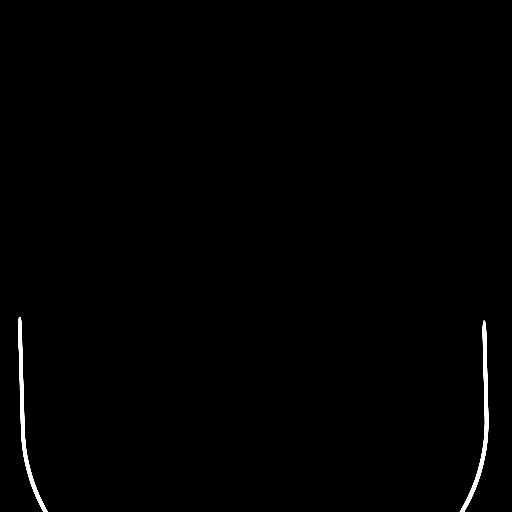

[16 of 30 positions shown; findings below may reference images not displayed]

FINDINGS: Ventricles are normal in size.  Negative for intracranial
hemorrhage.  There is no infarct or mass lesion.  No white matter
lesions are identified.  There is no skull abnormality.
IMPRESSION: No acute abnormality.

## 2011-02-23 LAB — T4, FREE: Free T4: 1.32

## 2011-02-23 LAB — POCT I-STAT, CHEM 8
BUN: 32 — ABNORMAL HIGH
Calcium, Ion: 1.21
Hemoglobin: 12.6 — ABNORMAL LOW
Sodium: 133 — ABNORMAL LOW
TCO2: 23

## 2011-02-23 LAB — CBC
MCHC: 33.5
MCV: 88.5
Platelets: 178
WBC: 5.7

## 2011-02-23 LAB — BASIC METABOLIC PANEL
BUN: 29 — ABNORMAL HIGH
Calcium: 10
Chloride: 103
Creatinine, Ser: 1.83 — ABNORMAL HIGH
GFR calc Af Amer: 47 — ABNORMAL LOW

## 2011-02-23 LAB — COMPREHENSIVE METABOLIC PANEL
ALT: 14
AST: 18
Albumin: 2.8 — ABNORMAL LOW
Calcium: 9.6
Chloride: 110
Creatinine, Ser: 1.48
GFR calc Af Amer: 60 — ABNORMAL LOW
Sodium: 142

## 2011-02-23 LAB — TSH: TSH: 0.11 — ABNORMAL LOW

## 2011-03-13 LAB — OCCULT BLOOD X 1 CARD TO LAB, STOOL
Fecal Occult Bld: NEGATIVE
Fecal Occult Bld: POSITIVE

## 2011-03-13 LAB — BASIC METABOLIC PANEL
BUN: 37 — ABNORMAL HIGH
BUN: 30 — ABNORMAL HIGH
CO2: 18 — ABNORMAL LOW
CO2: 19
CO2: 23
CO2: 24
Calcium: 9
Calcium: 9.2
Calcium: 9.5
Chloride: 112
Chloride: 109
Chloride: 110
Chloride: 114 — ABNORMAL HIGH
Creatinine, Ser: 3.46 — ABNORMAL HIGH
GFR calc Af Amer: 23 — ABNORMAL LOW
GFR calc Af Amer: 43 — ABNORMAL LOW
GFR calc Af Amer: 46 — ABNORMAL LOW
GFR calc Af Amer: 53 — ABNORMAL LOW
GFR calc Af Amer: 57 — ABNORMAL LOW
GFR calc non Af Amer: 19 — ABNORMAL LOW
GFR calc non Af Amer: 36 — ABNORMAL LOW
GFR calc non Af Amer: 38 — ABNORMAL LOW
Glucose, Bld: 130 — ABNORMAL HIGH
Glucose, Bld: 145 — ABNORMAL HIGH
Glucose, Bld: 215 — ABNORMAL HIGH
Glucose, Bld: 252 — ABNORMAL HIGH
Glucose, Bld: 67 — ABNORMAL LOW
Potassium: 6.5
Potassium: 4.3
Potassium: 4.8
Potassium: 5.4 — ABNORMAL HIGH
Potassium: 5.9 — ABNORMAL HIGH
Sodium: 135
Sodium: 138
Sodium: 138
Sodium: 140
Sodium: 144

## 2011-03-13 LAB — DIFFERENTIAL
Basophils Relative: 0
Lymphocytes Relative: 11 — ABNORMAL LOW
Lymphs Abs: 0.9
Monocytes Absolute: 0.2
Monocytes Relative: 2 — ABNORMAL LOW
Neutro Abs: 7.6

## 2011-03-13 LAB — CBC
HCT: 22.6 — ABNORMAL LOW
HCT: 23.9 — ABNORMAL LOW
HCT: 27.8 — ABNORMAL LOW
HCT: 30.5 — ABNORMAL LOW
Hemoglobin: 10 — ABNORMAL LOW
Hemoglobin: 10.3 — ABNORMAL LOW
Hemoglobin: 7.7 — CL
Hemoglobin: 9.5 — ABNORMAL LOW
MCHC: 33.2
MCHC: 33.6
MCHC: 34.1
MCV: 85.9
MCV: 86.4
MCV: 86.6
Platelets: 275
RBC: 3.45 — ABNORMAL LOW
RBC: 2.64 — ABNORMAL LOW
RBC: 3.21 — ABNORMAL LOW
RBC: 3.5 — ABNORMAL LOW
RDW: 16.4 — ABNORMAL HIGH
RDW: 17.3 — ABNORMAL HIGH
RDW: 17.7 — ABNORMAL HIGH
WBC: 4.7

## 2011-03-13 LAB — CROSSMATCH

## 2011-03-13 LAB — STOOL CULTURE

## 2011-03-13 LAB — CLOSTRIDIUM DIFFICILE EIA

## 2011-03-13 LAB — HEPATIC FUNCTION PANEL
ALT: 16
AST: 21
Alkaline Phosphatase: 39
Bilirubin, Direct: <0.1
Total Bilirubin: 0.5

## 2011-03-13 LAB — URINALYSIS, ROUTINE W REFLEX MICROSCOPIC
Glucose, UA: NEGATIVE
Hgb urine dipstick: NEGATIVE
Ketones, ur: NEGATIVE
Nitrite: NEGATIVE
Protein, ur: NEGATIVE
Urobilinogen, UA: 0.2
pH: 5.5

## 2011-03-13 LAB — URINE CULTURE: Colony Count: NO GROWTH

## 2011-03-13 LAB — TROPONIN I: Troponin I: <0.01

## 2011-03-13 LAB — I-STAT 8, (EC8 V) (CONVERTED LAB)
Acid-base deficit: 7 — ABNORMAL HIGH
HCT: 28 — ABNORMAL LOW
Hemoglobin: 9.5 — ABNORMAL LOW
Potassium: 6.2 — ABNORMAL HIGH
Sodium: 139
TCO2: 20

## 2011-03-13 LAB — CK TOTAL AND CKMB (NOT AT ARMC): Relative Index: 3 — ABNORMAL HIGH

## 2011-03-13 LAB — SODIUM, URINE, RANDOM: Sodium, Ur: 102

## 2011-03-13 LAB — CARDIAC PANEL(CRET KIN+CKTOT+MB+TROPI): Total CK: 269 — ABNORMAL HIGH

## 2011-03-14 LAB — BASIC METABOLIC PANEL
BUN: 26 — ABNORMAL HIGH
BUN: 26 — ABNORMAL HIGH
CO2: 22
CO2: 23
Calcium: 9.6
Calcium: 9.8
Creatinine, Ser: 1.76 — ABNORMAL HIGH
GFR calc non Af Amer: 41 — ABNORMAL LOW
GFR calc non Af Amer: 49 — ABNORMAL LOW
Glucose, Bld: 117 — ABNORMAL HIGH
Glucose, Bld: 176 — ABNORMAL HIGH
Sodium: 138
Sodium: 139

## 2011-03-14 LAB — CBC
Hemoglobin: 9.2 — ABNORMAL LOW
Hemoglobin: 9.4 — ABNORMAL LOW
MCHC: 35.1
MCHC: 35.3
Platelets: 251
Platelets: 284
RDW: 14.2 — ABNORMAL HIGH
RDW: 14.7 — ABNORMAL HIGH

## 2011-03-14 LAB — CK TOTAL AND CKMB (NOT AT ARMC)
CK, MB: 1
Relative Index: INVALID
Total CK: 88

## 2011-03-15 LAB — HEMOGLOBIN A1C: Hgb A1c MFr Bld: 7 — ABNORMAL HIGH

## 2011-03-15 LAB — URINALYSIS, MICROSCOPIC ONLY
Bilirubin Urine: NEGATIVE
Glucose, UA: 250 — AB
Hgb urine dipstick: NEGATIVE
Ketones, ur: NEGATIVE
Protein, ur: NEGATIVE
Urine-Other: NONE SEEN

## 2011-03-15 LAB — CBC
Hemoglobin: 10.3 — ABNORMAL LOW
Platelets: 307
RDW: 14.6 — ABNORMAL HIGH
WBC: 7.7

## 2011-03-15 LAB — DIFFERENTIAL
Basophils Absolute: 0
Lymphocytes Relative: 12
Lymphs Abs: 0.9
Neutro Abs: 6.2

## 2011-03-15 LAB — VITAMIN B12: Vitamin B-12: 643 (ref 211–911)

## 2011-03-15 LAB — PROTIME-INR
INR: 1.2
Prothrombin Time: 15

## 2011-03-15 LAB — URINE CULTURE
Colony Count: NO GROWTH
Culture: NO GROWTH

## 2011-03-15 LAB — C4 COMPLEMENT: Complement C4, Body Fluid: 22

## 2011-03-15 LAB — BASIC METABOLIC PANEL
BUN: 27 — ABNORMAL HIGH
Calcium: 10.4
GFR calc non Af Amer: 36 — ABNORMAL LOW
Glucose, Bld: 183 — ABNORMAL HIGH
Potassium: 4.6
Sodium: 138

## 2011-03-15 LAB — IRON AND TIBC
Saturation Ratios: 4 — ABNORMAL LOW
UIBC: 259

## 2011-03-15 LAB — C3 COMPLEMENT: C3 Complement: 181

## 2011-03-15 LAB — FOLATE: Folate: >20

## 2011-03-15 LAB — FERRITIN: Ferritin: 213 (ref 22–322)

## 2011-03-15 LAB — CK TOTAL AND CKMB (NOT AT ARMC)
CK, MB: 1.3
Total CK: 163

## 2011-03-15 LAB — SEDIMENTATION RATE: Sed Rate: 136 — ABNORMAL HIGH

## 2011-03-15 LAB — APTT: aPTT: 27

## 2011-09-28 IMAGING — CT CT L SPINE W/O CM
4 of 10 series · 12 of 33 positions shown, 14 images · non-contrast
Comparison: Radiographs 04/21/2005.

CLINICAL DATA: 59-year-old male with gait disturbance.  Chronic low
back pain.  Diabetic peripheral neuropathy.

CT LUMBAR SPINE WITHOUT CONTRAST
TECHNIQUE: Multidetector CT imaging of the lumbar spine was
performed without intravenous contrast administration. Multiplanar
CT image reconstructions were also generated.

[Series 3: l spine bone · axial · 0.27mm/px · z∈[-10,+77]mm · 2 of 106 slices shown, 3 images]
[im 36/106  soft-tissue]
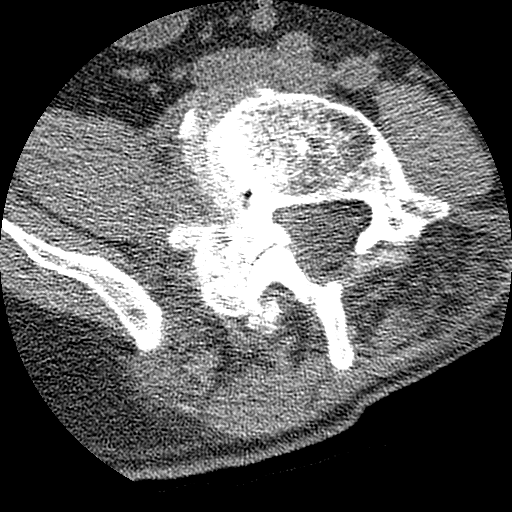
[im 36/106  bone]
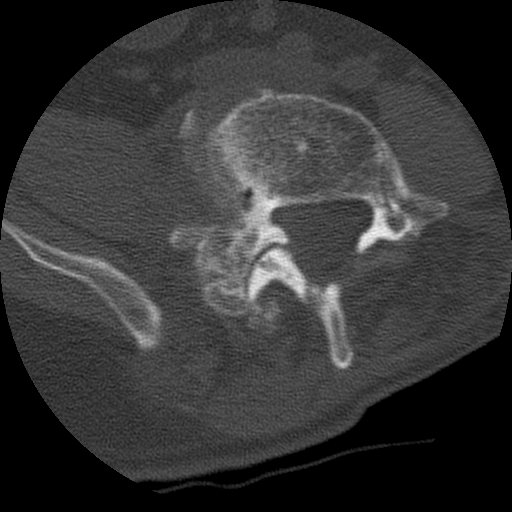
[im 71/106  bone]
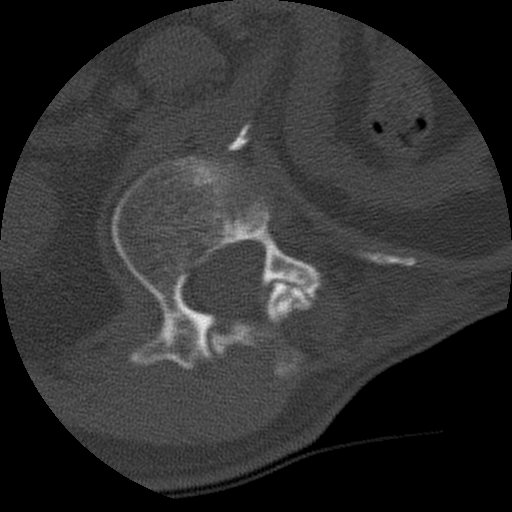

[Series 4: l spine soft · axial · 0.27mm/px · z∈[-10,+77]mm · 2 of 106 slices shown]
[im 36/106  soft-tissue]
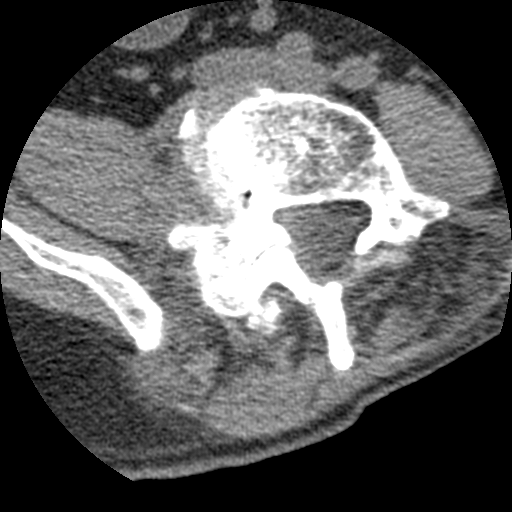
[im 71/106  soft-tissue]
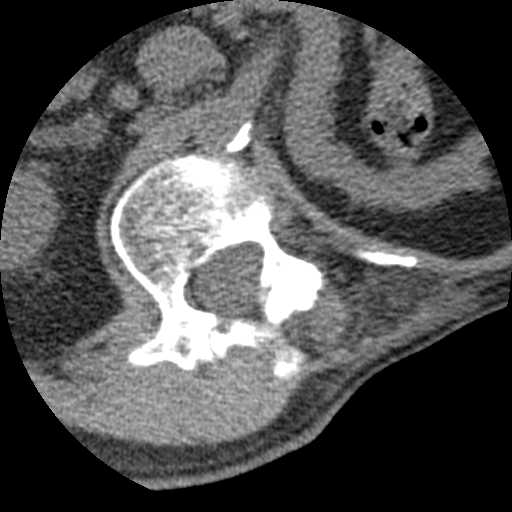

[Series 200: coronals · coronal · 0.53mm/px · 3 of 51 slices shown]
[im 11/51  bone]
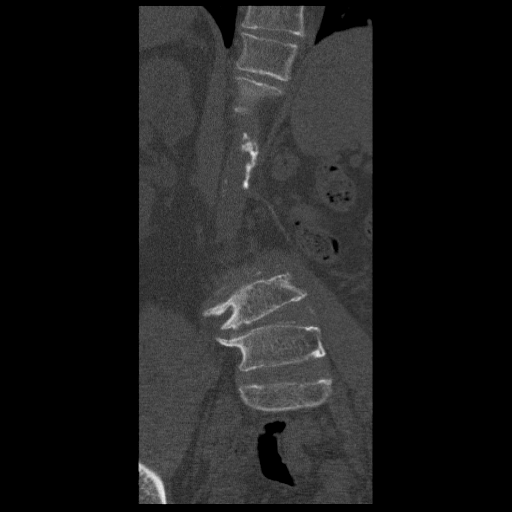
[im 21/51  bone]
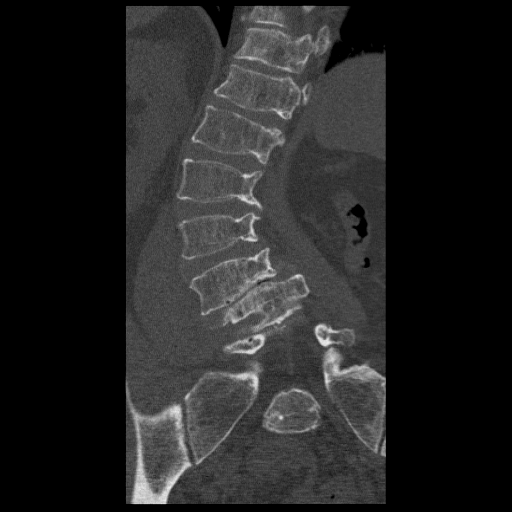
[im 31/51  bone]
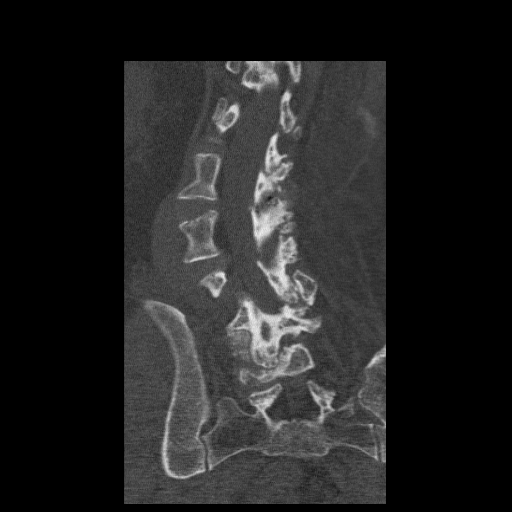

[Series 201: sagittals · sagittal · 0.53mm/px · 5 of 68 slices shown, 6 images]
[im 23/68  bone]
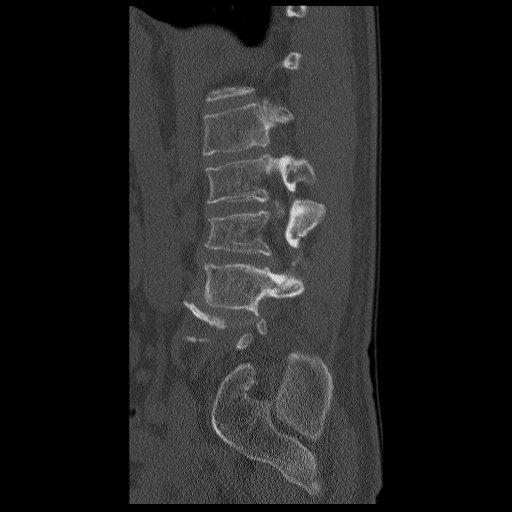
[im 28/68  bone]
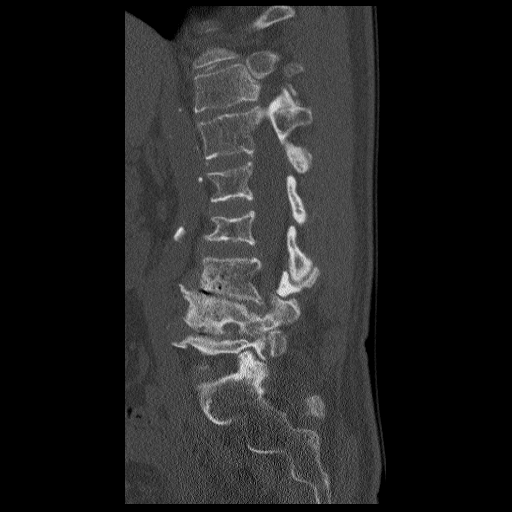
[im 34/68  soft-tissue]
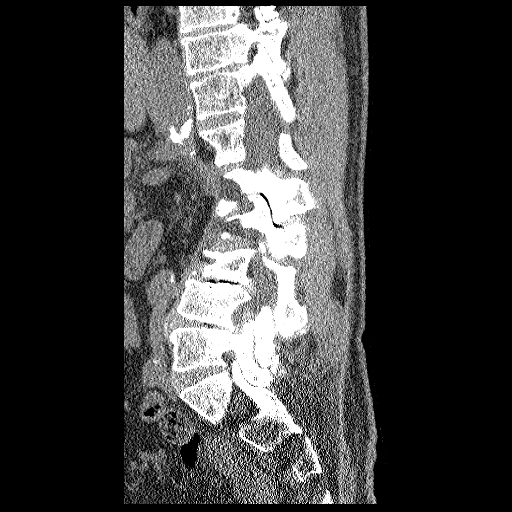
[im 34/68  bone]
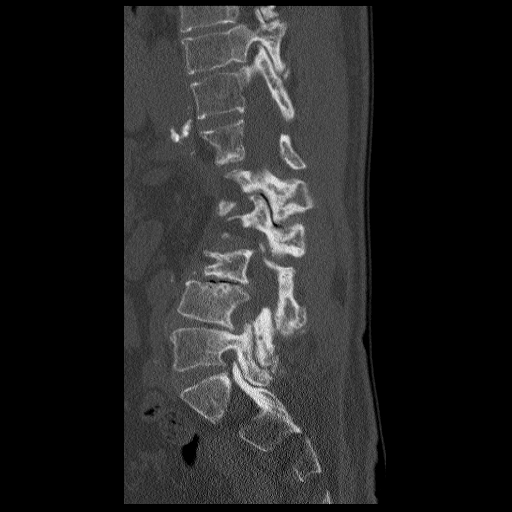
[im 40/68  bone]
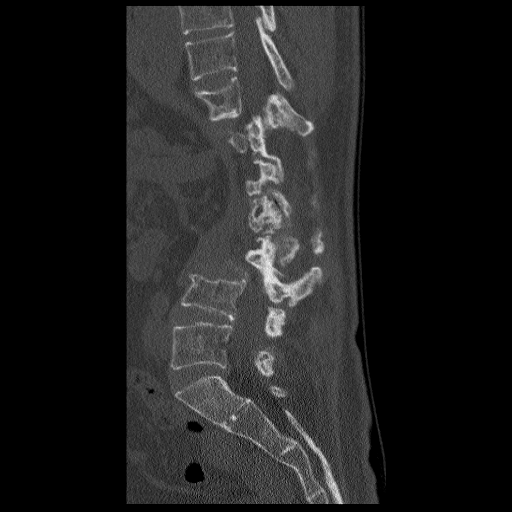
[im 45/68  bone]
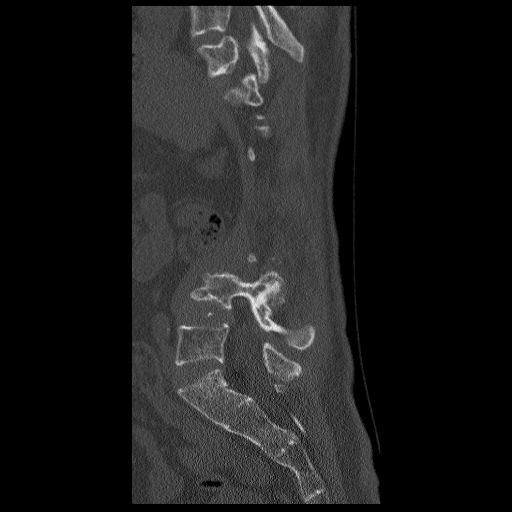

[12 of 33 positions shown; findings below may reference images not displayed]

FINDINGS: New cardiac pacemaker wires are seen projecting over the
heart and that time.

Visualized lung bases are clear.  24 mm low density area in the
visualized medial right kidney with densitometry suggestive of
simple cyst.  Calcified atherosclerosis of the abdominal aorta.
Visualized pelvic viscera within normal limits.

Visualized sacrum and SI joints are within normal limits aside from
osteopenia.

Severe dextroconvex lumbar scoliosis is chronic in appears not
significantly changed since 9225.  There is superimposed grade 1
retrolisthesis of L3 on L4 (series 2 one image 31).  There is
severe chronic L3-L4 and L4-L5 disc degeneration with vacuum disc
phenomena and endplate spurring.  There is very severe lumbar facet
degeneration extending from T12-L1 on the left to L4-L5 on the
right.  Extensive facet overgrowth and spurring is noted in
addition to several levels of vacuum facet phenomena and
periarticular erosions (L1-L2 on the left series 3 image 42).

There is multifactorial moderate spinal stenosis at L2-L3 and L3-
L4.  There is associated left lateral recess stenosis at both
levels.  There is scoliosis and facet related moderate to severe
neural foraminal stenosis on the right at L4-L5 and L5-S1.
Similarly, there is scoliosis related left neural foraminal
stenosis which is moderate to severe at L1-L2 and L3-L4.
IMPRESSION: 1.  Chronic severe dextroconvex lumbar scoliosis.  There is
superimposed grade 1 retrolisthesis of L3 on L4, chronic severe L3-
L4 and L4-L5 disc degeneration, and fairly diffuse chronic severe
lumbar facet arthropathy.
2.  Multifactorial moderate spinal stenosis at L2-L3 and L3-L4, and
moderate to severe neural foraminal stenosis on the right at L4-L5
and L5-S1 and on the left at L1-L2 and L3-L4.
3.  The right renal probable simple cyst.

## 2011-12-12 HISTORY — PX: SPINE SURGERY: SHX786

## 2013-07-02 ENCOUNTER — Encounter: Payer: Self-pay | Admitting: Internal Medicine

## 2013-07-03 ENCOUNTER — Encounter: Payer: Self-pay | Admitting: Internal Medicine

## 2013-11-03 ENCOUNTER — Encounter: Payer: Self-pay | Admitting: Cardiology

## 2013-11-04 ENCOUNTER — Telehealth: Payer: Self-pay | Admitting: Internal Medicine

## 2013-11-04 NOTE — Telephone Encounter (Addendum)
07-30-98 SENT CERTIFIED LETTER RE PAST DUE DEVICE CHECK/MT 11-14-13 rtn mail,not deliverable, lmm @ 341pm to have pt call with correct address and let us know if being followed elsewhere/mt
